# Patient Record
Sex: Male | Born: 1955 | Race: White | Hispanic: No | Marital: Single | Smoking: Never smoker
Health system: Southern US, Community
[De-identification: ages and names within clinical notes are randomized; demographics above are authoritative.]

## PROBLEM LIST (undated history)

## (undated) DIAGNOSIS — M47816 Spondylosis without myelopathy or radiculopathy, lumbar region: Secondary | ICD-10-CM

## (undated) DIAGNOSIS — K219 Gastro-esophageal reflux disease without esophagitis: Secondary | ICD-10-CM

## (undated) DIAGNOSIS — G8929 Other chronic pain: Secondary | ICD-10-CM

## (undated) DIAGNOSIS — M5442 Lumbago with sciatica, left side: Secondary | ICD-10-CM

## (undated) DIAGNOSIS — I10 Essential (primary) hypertension: Secondary | ICD-10-CM

## (undated) DIAGNOSIS — E785 Hyperlipidemia, unspecified: Secondary | ICD-10-CM

## (undated) DIAGNOSIS — K589 Irritable bowel syndrome without diarrhea: Secondary | ICD-10-CM

## (undated) HISTORY — DX: Lumbago with sciatica, left side: M54.42

## (undated) HISTORY — DX: Hyperlipidemia, unspecified: E78.5

## (undated) HISTORY — DX: Spondylosis without myelopathy or radiculopathy, lumbar region: M47.816

## (undated) HISTORY — DX: Other chronic pain: G89.29

## (undated) HISTORY — DX: Gastro-esophageal reflux disease without esophagitis: K21.9

## (undated) HISTORY — DX: Irritable bowel syndrome without diarrhea: K58.9

---

## 2013-04-04 ENCOUNTER — Emergency Department (HOSPITAL_COMMUNITY)
Admission: EM | Admit: 2013-04-04 | Discharge: 2013-04-04 | Disposition: A | Discharge status: Home Health | Payer: Federal, State, Local not specified - PPO | Attending: Emergency Medicine | Admitting: Emergency Medicine

## 2013-04-04 ENCOUNTER — Encounter (HOSPITAL_COMMUNITY): Payer: Self-pay | Admitting: *Deleted

## 2013-04-04 ENCOUNTER — Emergency Department (HOSPITAL_COMMUNITY): Payer: Federal, State, Local not specified - PPO

## 2013-04-04 DIAGNOSIS — R42 Dizziness and giddiness: Secondary | ICD-10-CM | POA: Insufficient documentation

## 2013-04-04 DIAGNOSIS — Z79899 Other long term (current) drug therapy: Secondary | ICD-10-CM | POA: Insufficient documentation

## 2013-04-04 DIAGNOSIS — M549 Dorsalgia, unspecified: Secondary | ICD-10-CM | POA: Insufficient documentation

## 2013-04-04 DIAGNOSIS — I10 Essential (primary) hypertension: Secondary | ICD-10-CM | POA: Insufficient documentation

## 2013-04-04 DIAGNOSIS — R252 Cramp and spasm: Secondary | ICD-10-CM

## 2013-04-04 DIAGNOSIS — R51 Headache: Secondary | ICD-10-CM | POA: Insufficient documentation

## 2013-04-04 DIAGNOSIS — R197 Diarrhea, unspecified: Secondary | ICD-10-CM | POA: Insufficient documentation

## 2013-04-04 HISTORY — DX: Essential (primary) hypertension: I10

## 2013-04-04 LAB — COMPREHENSIVE METABOLIC PANEL
ALT: 46 U/L (ref 0–53)
CO2: 28 mEq/L (ref 19–32)
Calcium: 9.2 mg/dL (ref 8.4–10.5)
Creatinine, Ser: 1.05 mg/dL (ref 0.50–1.35)
GFR calc Af Amer: >90 mL/min (ref >90)
GFR calc non Af Amer: 78 mL/min — ABNORMAL LOW (ref >90)
Glucose, Bld: 101 mg/dL — ABNORMAL HIGH (ref 70–99)
Sodium: 135 mEq/L (ref 135–145)
Total Protein: 7.1 g/dL (ref 6.0–8.3)

## 2013-04-04 LAB — CBC WITH DIFFERENTIAL/PLATELET
Eosinophils Absolute: 0.1 10*3/uL (ref 0.0–0.7)
Eosinophils Relative: 2 % (ref 0–5)
HCT: 48.2 % (ref 39.0–52.0)
Lymphocytes Relative: 26 % (ref 12–46)
Lymphs Abs: 1.8 10*3/uL (ref 0.7–4.0)
MCH: 27.7 pg (ref 26.0–34.0)
MCV: 83.4 fL (ref 78.0–100.0)
Monocytes Absolute: 0.8 10*3/uL (ref 0.1–1.0)
Platelets: 162 10*3/uL (ref 150–400)
RBC: 5.78 MIL/uL (ref 4.22–5.81)
RDW: 13.1 % (ref 11.5–15.5)

## 2013-04-04 LAB — MAGNESIUM: Magnesium: 2.1 mg/dL (ref 1.5–2.5)

## 2013-04-04 LAB — CK: Total CK: 161 U/L (ref 7–232)

## 2013-04-04 LAB — LACTIC ACID, PLASMA: Lactic Acid, Venous: 1.1 mmol/L (ref 0.5–2.2)

## 2013-04-04 MED ORDER — SODIUM CHLORIDE 0.9 % IV BOLUS (SEPSIS)
1000.0000 mL | Freq: Once | INTRAVENOUS | Status: AC
  Administered 2013-04-04: 1000 mL via INTRAVENOUS

## 2013-04-04 NOTE — ED Notes (Signed)
Reports onset wed of sweats/chills, headache and bodyaches. Had diarrhea on wed but denies any vomiting.

## 2013-04-04 NOTE — ED Provider Notes (Signed)
History     CSN: [id number]  Arrival date & time 04/04/13  1614   First MD Initiated Contact with Patient 04/04/13 1656      Chief Complaint  Patient presents with  . Fever  . 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  Wednesday he got very hot, developed tingling and cramping of all extremities. Yesterday he develop pain in his lower back.  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  Also has been getting quick sharp pains in his right temple. States today he got out of his truck and felt confused and felt like he was stumbling.  Denies fevers, chills, recent illness, weakness of the extremities.  Denies nausea.  Denies visual changes, balance problems, difficulty with his gait currently.  Denies CP, abdominal pain.   Patient is a 57 y.o. male presenting with fever and diarrhea. The history is provided by the patient.  Fever Associated symptoms: diarrhea and headaches   Associated symptoms: no chest pain, no cough, no dysuria, no nausea, no rash and no vomiting   Diarrhea Associated symptoms: headaches   Associated symptoms: no abdominal pain, no fever and no vomiting     Past Medical History  Diagnosis Date  . Hypertension     History reviewed. No pertinent past surgical history.  History reviewed. No pertinent family history.  History  Substance Use Topics  . Smoking status: Not on file  . Smokeless tobacco: Not on file  . Alcohol Use: No      Review of Systems  Constitutional: Negative for fever and appetite change.  Respiratory: Negative for cough and shortness of breath.   Cardiovascular: Negative for chest pain.  Gastrointestinal: Positive for diarrhea. Negative for nausea, vomiting and abdominal pain.       One episode of "diarrhea" 3 days ago,  resolved  Genitourinary: Negative for dysuria, urgency and frequency.  Musculoskeletal: Positive for back pain.  Skin: Negative for rash and wound.  Neurological: Positive for dizziness and headaches. Negative for syncope.    Allergies  Review of patient's allergies indicates no known allergies.  Home Medications   Current Outpatient Rx  Name  Route  Sig  Dispense  Refill  . lisinopril (PRINIVIL,ZESTRIL) 10 MG tablet   Oral   Take 10 mg by mouth daily.           BP 148/65  Pulse 72  Temp(Src) 97.9 F (36.6 C) (Oral)  Resp 18  SpO2 95%  Physical Exam  Nursing note and vitals reviewed. Constitutional: He appears well-developed and well-nourished. No distress.  HENT:  Head: Normocephalic and atraumatic.  Neck: Neck supple.  Cardiovascular: Normal rate and regular rhythm.   Pulmonary/Chest: Effort normal and breath sounds normal. No respiratory distress. He has no wheezes. He has no rales.  Abdominal: Soft. He exhibits no distension and no mass. There is no tenderness. There is no rebound and no guarding.  Musculoskeletal:  Lower extremities:  Strength 5/5, sensation intact, distal pulses intact.    Neurological: He is alert. He exhibits normal muscle tone.  Skin: He is not diaphoretic.    ED Course  Procedures (including critical care time)  Labs Reviewed  COMPREHENSIVE METABOLIC PANEL - Abnormal; Notable for the following:    Glucose, Bld 101 (*)    GFR calc non Af Amer 78 (*)  All other components within normal limits  CBC WITH DIFFERENTIAL  MAGNESIUM  LACTIC ACID, PLASMA  CK   Dg Lumbar Spine Complete  04/04/2013   *RADIOLOGY REPORT*  Clinical Data: Fever.  Diarrhea.  Mid back pain for 2 days. Tingling in both legs.  No injury.  LUMBAR SPINE - COMPLETE 4+ VIEW  Comparison: None.  Findings: Anatomic alignment of the lumbar spine.  Five lumbar type vertebral bodies.  Intervertebral disc height is preserved.  Mild degenerative disc disease at L1-L2 and L2-L3.   No pars defects. Right L5-S1 facet arthrosis.  Lumbosacral junction appears normal. Metallic density projects over the rectum on the frontal view, presumably external to the patient.  This is not seen on any of the other views.  IMPRESSION: Mild lumbar spondylosis.  No acute osseous abnormality.   Original Report Authenticated By: Andreas Newport, M.D.    5:54 PM Discussed pt with Dr Anitra Lauth.    7:47 PM Pt feeling much better after IVF.    1. 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  He drinks large amounts of water daily and attempts to also drink gatorade to replace electrolytes.  P/W cramps and tingling in his extremities, one episode of low back pain.  Neurovascularly intact on exam.  No red flags. Discussed all results with patient. Pt to f/u with PCP in 2-3 days. Pt given return precautions.  Pt verbalizes understanding and agrees with plan.           Trixie Dredge, PA-C 04/04/13 2107

## 2013-04-07 NOTE — ED Provider Notes (Signed)
Medical screening examination/treatment/procedure(s) were performed by non-physician practitioner and as supervising physician I was immediately available for consultation/collaboration.   Gwyneth Sprout, MD 04/07/13 1124

## 2014-10-21 NOTE — ED Notes (Signed)
Patient here today requesting a note to validate need to wear shorts.  Spoke with Dr Anitra LauthPlunkett and note created stating due to patient's complaints related to heat, patient should be allowed to wear shorts as needed to prevent repeat episodes.

## 2015-05-08 ENCOUNTER — Emergency Department (INDEPENDENT_AMBULATORY_CARE_PROVIDER_SITE_OTHER)
Admission: EM | Admit: 2015-05-08 | Discharge: 2015-05-08 | Disposition: A | Discharge status: Home / Self Care | Payer: Worker's Compensation | Source: Home / Self Care

## 2015-05-08 ENCOUNTER — Encounter (HOSPITAL_COMMUNITY): Payer: Self-pay | Admitting: Emergency Medicine

## 2015-05-08 ENCOUNTER — Emergency Department (INDEPENDENT_AMBULATORY_CARE_PROVIDER_SITE_OTHER): Payer: Federal, State, Local not specified - PPO

## 2015-05-08 DIAGNOSIS — M25551 Pain in right hip: Secondary | ICD-10-CM

## 2015-05-08 DIAGNOSIS — W010XXA Fall on same level from slipping, tripping and stumbling without subsequent striking against object, initial encounter: Secondary | ICD-10-CM | POA: Diagnosis not present

## 2015-05-08 MED ORDER — DICLOFENAC SODIUM 75 MG PO TBEC: 75.0000 mg | DELAYED_RELEASE_TABLET | Freq: Two times a day (BID) | ORAL | Status: DC

## 2015-05-08 MED ORDER — HYDROCODONE-ACETAMINOPHEN 5-325 MG PO TABS: 1.0000 | ORAL_TABLET | Freq: Four times a day (QID) | ORAL | Status: DC | PRN

## 2015-05-08 NOTE — ED Provider Notes (Signed)
CSN: [id number]     Arrival date & time 05/08/15  1502 History   First MD Initiated Contact with Patient 05/08/15 1545     Chief Complaint  Patient presents with  . Fall   (Consider location/radiation/quality/duration/timing/severity/associated sxs/prior Treatment) Patient is a 59 y.o. male presenting with fall. The history is provided by the patient.  Fall This is a new problem. The current episode started 3 to 5 hours ago. The problem occurs constantly. The problem has been gradually worsening. Pertinent negatives include no chest pain, no abdominal pain, no headaches and no shortness of breath. Nothing aggravates the symptoms. Nothing relieves the symptoms. He has tried nothing for the symptoms.   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  Left leg went out from under him and he landed on right hip.  He continued delivery route but pain worsened.  Pain is radiating down side of leg.  Worse with weight bearing Past Medical History  Diagnosis Date  . Hypertension    History reviewed. No pertinent past surgical history. History reviewed. No pertinent family history. History  Substance Use Topics  . Smoking status: Not on file  . Smokeless tobacco: Not on file  . Alcohol Use: No    Review of Systems  Constitutional: Negative.   HENT: Negative.   Eyes: Negative.   Respiratory: Negative.  Negative for shortness of breath.   Cardiovascular: Negative.  Negative for chest pain.  Gastrointestinal: Negative.  Negative for abdominal pain.  Genitourinary: Negative.   Musculoskeletal:       Pain and tenderness superior pelvis on right upper crest  Skin: Negative.   Neurological: Negative for dizziness, syncope, weakness and headaches.       Negative SLR, normal KJ and AJ Good active ROM knee and ankle  Psychiatric/Behavioral: Negative.     Allergies  Review of patient's allergies indicates no known allergies.  Home Medications   Prior to Admission medications    Medication Sig Start Date End Date Taking? Authorizing Provider  lisinopril (PRINIVIL,ZESTRIL) 10 MG tablet Take 10 mg by mouth daily.    Historical Provider, MD   BP 146/74 mmHg  Pulse 65  Temp(Src) 98.4 F (36.9 C) (Oral)  Resp 16  SpO2 97% Physical Exam  Constitutional: He is oriented to person, place, and time. He appears well-developed and well-nourished.  Neck: Normal range of motion. Neck supple.  Pulmonary/Chest: Effort normal.  Musculoskeletal:  Good hip, knee and ankle passive ROM Tender superior right iliac crest. No ecchymosis  Normal upper extremities.  Neurological: He is alert and oriented to person, place, and time. He has normal reflexes.  Skin: Skin is warm and dry.  Psychiatric: He has a normal mood and affect. His behavior is normal. Judgment and thought content normal.    ED Course  Procedures (including critical care time) Labs Review Labs Reviewed - No data to display  Imaging Review Preliminary reading of right hip:  No fracture seen  MDM   1. Fall due to wet surface, initial encounter    This seems rather painful, and I don't think patient should bear weight at work until rechecked in 2 days. I will fill out the Worker's Comp. form sit the patient brought with him and give him a note to return here for recheck in 48 hours.      ICD-9-CM ICD-10-CM   1. Hip pain, acute, right 719.45 M25.551 HYDROcodone-acetaminophen (NORCO) 5-325 MG per tablet     diclofenac (VOLTAREN) 75 MG EC  tablet  2. Fall due to wet surface, initial encounter E885.9 W01.0XXA DG Hip Unilat With Pelvis 2-3 Views Right     DG Hip Unilat With Pelvis 2-3 Views Right     HYDROcodone-acetaminophen (NORCO) 5-325 MG per tablet     diclofenac (VOLTAREN) 75 MG EC tablet     Elvina Sidle, MD  Elvina Sidle, MD 05/08/15 [phone]

## 2015-05-08 NOTE — Discharge Instructions (Signed)
Please return in 2 days so that we can better determine your work status for the next week.  Do not return to work until rechecked Monday afternoon.  In the meantime, rest the leg and hip and take the medicines as prescribed.

## 2015-05-08 NOTE — ED Notes (Signed)
Here for workers comp States he slipped on sidewalk with left leg today States right leg hurts and right butt cheek is painful States right leg is numb

## 2017-05-25 ENCOUNTER — Encounter (HOSPITAL_COMMUNITY): Payer: Self-pay

## 2017-05-25 ENCOUNTER — Emergency Department (HOSPITAL_COMMUNITY): Payer: Federal, State, Local not specified - PPO

## 2017-05-25 ENCOUNTER — Emergency Department (HOSPITAL_COMMUNITY)
Admission: EM | Admit: 2017-05-25 | Discharge: 2017-05-25 | Disposition: A | Discharge status: Home / Self Care | Payer: Federal, State, Local not specified - PPO | Attending: Emergency Medicine | Admitting: Emergency Medicine

## 2017-05-25 DIAGNOSIS — Z79899 Other long term (current) drug therapy: Secondary | ICD-10-CM | POA: Diagnosis not present

## 2017-05-25 DIAGNOSIS — Y990 Civilian activity done for income or pay: Secondary | ICD-10-CM | POA: Diagnosis not present

## 2017-05-25 DIAGNOSIS — T675XXA Heat exhaustion, unspecified, initial encounter: Secondary | ICD-10-CM | POA: Diagnosis not present

## 2017-05-25 DIAGNOSIS — X30XXXA Exposure to excessive natural heat, initial encounter: Secondary | ICD-10-CM | POA: Insufficient documentation

## 2017-05-25 DIAGNOSIS — Y92812 Truck as the place of occurrence of the external cause: Secondary | ICD-10-CM | POA: Insufficient documentation

## 2017-05-25 DIAGNOSIS — Y9389 Activity, other specified: Secondary | ICD-10-CM | POA: Insufficient documentation

## 2017-05-25 DIAGNOSIS — I10 Essential (primary) hypertension: Secondary | ICD-10-CM | POA: Insufficient documentation

## 2017-05-25 DIAGNOSIS — R200 Anesthesia of skin: Secondary | ICD-10-CM | POA: Diagnosis present

## 2017-05-25 LAB — COMPREHENSIVE METABOLIC PANEL
ALK PHOS: 82 U/L (ref 38–126)
ALT: 23 U/L (ref 17–63)
AST: 25 U/L (ref 15–41)
Albumin: 3.7 g/dL (ref 3.5–5.0)
Anion gap: 5 (ref 5–15)
BILIRUBIN TOTAL: 1 mg/dL (ref 0.3–1.2)
BUN: 13 mg/dL (ref 6–20)
CALCIUM: 9 mg/dL (ref 8.9–10.3)
CO2: 27 mmol/L (ref 22–32)
CREATININE: 0.93 mg/dL (ref 0.61–1.24)
Chloride: 107 mmol/L (ref 101–111)
GFR calc Af Amer: >60 mL/min (ref >60)
Glucose, Bld: 91 mg/dL (ref 65–99)
POTASSIUM: 4.3 mmol/L (ref 3.5–5.1)
Sodium: 139 mmol/L (ref 135–145)
TOTAL PROTEIN: 6.2 g/dL — AB (ref 6.5–8.1)

## 2017-05-25 LAB — CBC WITH DIFFERENTIAL/PLATELET
BASOS ABS: 0 10*3/uL (ref 0.0–0.1)
Basophils Relative: 1 %
Eosinophils Absolute: 0.2 10*3/uL (ref 0.0–0.7)
Eosinophils Relative: 3 %
HEMATOCRIT: 46.4 % (ref 39.0–52.0)
HEMOGLOBIN: 15.4 g/dL (ref 13.0–17.0)
LYMPHS PCT: 27 %
Lymphs Abs: 1.5 10*3/uL (ref 0.7–4.0)
MCH: 27.7 pg (ref 26.0–34.0)
MCHC: 33.2 g/dL (ref 30.0–36.0)
MCV: 83.6 fL (ref 78.0–100.0)
MONO ABS: 0.6 10*3/uL (ref 0.1–1.0)
Monocytes Relative: 10 %
NEUTROS ABS: 3.4 10*3/uL (ref 1.7–7.7)
Neutrophils Relative %: 59 %
Platelets: 158 10*3/uL (ref 150–400)
RBC: 5.55 MIL/uL (ref 4.22–5.81)
RDW: 12.8 % (ref 11.5–15.5)
WBC: 5.7 10*3/uL (ref 4.0–10.5)

## 2017-05-25 LAB — I-STAT TROPONIN, ED: Troponin i, poc: 0 ng/mL (ref 0.00–0.08)

## 2017-05-25 LAB — CK: CK TOTAL: 97 U/L (ref 49–397)

## 2017-05-25 LAB — MAGNESIUM: MAGNESIUM: 2 mg/dL (ref 1.7–2.4)

## 2017-05-25 MED ORDER — METOCLOPRAMIDE HCL 5 MG/ML IJ SOLN
10.0000 mg | Freq: Once | INTRAMUSCULAR | Status: AC
  Administered 2017-05-25: 10 mg via INTRAVENOUS
  Filled 2017-05-25: qty 2

## 2017-05-25 MED ORDER — DIPHENHYDRAMINE HCL 50 MG/ML IJ SOLN
25.0000 mg | Freq: Once | INTRAMUSCULAR | Status: AC
  Administered 2017-05-25: 25 mg via INTRAVENOUS
  Filled 2017-05-25: qty 1

## 2017-05-25 MED ORDER — SODIUM CHLORIDE 0.9 % IV BOLUS (SEPSIS)
1000.0000 mL | Freq: Once | INTRAVENOUS | Status: AC
  Administered 2017-05-25: 1000 mL via INTRAVENOUS

## 2017-05-25 NOTE — ED Notes (Signed)
Patient transported to CT 

## 2017-05-25 NOTE — ED Notes (Signed)
Patient transported to X-ray 

## 2017-05-25 NOTE — ED Provider Notes (Signed)
[facility]-EMERGENCY DEPT Provider Note   CSN: [id number] Arrival date & time: 05/25/17  1454     History   Chief Complaint Chief Complaint  Patient presents with  . Numbness    HPI [name] is a 61 y.o. male.  HPI   61 year old male with past medical history of hypertension who presents with multiple complaints. 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 He states that he was working in the truck when he began to feel lightheaded. He then developed a mild, aching headache with bilateral hand tingling and numbness. He then noticed a swelling sensation in numbness in his right leg. Since then, his tingling has persisted, worse on the right but also present on the left, as well as a dull, aching, throbbing headache. Denies any chest pain or shortness of breath. Denies any difficulty speaking or swallowing. He does endorse a sensation of intermittent blurred vision but denies any double vision. No other medical complaint.  Past Medical History:  Diagnosis Date  . Hypertension     There are no active problems to display for this patient.   History reviewed. No pertinent surgical history.     Home Medications    Prior to Admission medications   Medication Sig Start Date End Date Taking? Authorizing Provider  acetaminophen (TYLENOL) 500 MG tablet Take 500 mg by mouth every 6 (six) hours as needed.   Yes [provider]  ibuprofen (ADVIL,MOTRIN) 200 MG tablet Take 200 mg by mouth every 6 (six) hours as needed (for pain).   Yes [provider]  diclofenac (VOLTAREN) 75 MG EC tablet Take 1 tablet (75 mg total) by mouth 2 (two) times daily. Patient not taking: Reported on 05/25/2017 05/08/15   Elvina SidleLauenstein, Kurt, MD  HYDROcodone-acetaminophen (NORCO) 5-325 MG per tablet Take 1 tablet by mouth every 6 (six) hours as needed for moderate pain. 05/08/15   Elvina SidleLauenstein, Kurt, MD  lisinopril (PRINIVIL,ZESTRIL) 10 MG  tablet Take 10 mg by mouth daily.    [provider]    Family History History reviewed. No pertinent family history.  Social History Social History  Substance Use Topics  . Smoking status: Never Smoker  . Smokeless tobacco: Current User    Types: Snuff  . Alcohol use No     Allergies   Patient has no known allergies.   Review of Systems Review of Systems  Constitutional: Positive for diaphoresis and fatigue.  Neurological: Positive for numbness and headaches.  All other systems reviewed and are negative.    Physical Exam Updated Vital Signs BP 138/74 (BP Location: Right Arm)   Pulse (!) 55   Temp 98.6 F (37 C) (Oral)   Resp 17   Ht 5\' 11"  (1.803 m)   Wt 122.5 kg (270 lb)   SpO2 94%   BMI 37.66 kg/m   Physical Exam  Constitutional: He is oriented to person, place, and time. He appears well-developed and well-nourished. No distress.  HENT:  Head: Normocephalic and atraumatic.  Mouth/Throat: Oropharynx is clear and moist.  Eyes: Conjunctivae are normal.  Neck: Neck supple.  Cardiovascular: Normal rate, regular rhythm and normal heart sounds.  Exam reveals no friction rub.   No murmur heard. Pulmonary/Chest: Effort normal and breath sounds normal. No respiratory distress. He has no wheezes. He has no rales.  Abdominal: He exhibits no distension.  Musculoskeletal: He exhibits no edema.  Neurological: He is alert and oriented to  person, place, and time. He exhibits normal muscle tone.  Skin: Skin is warm. Capillary refill takes less than 2 seconds.  Psychiatric: He has a normal mood and affect.  Nursing note and vitals reviewed.   Neurological Exam:  Mental Status: Alert and oriented to person, place, and time. Attention and concentration normal. Speech clear. Recent memory is intact. Cranial Nerves: Visual fields grossly intact. EOMI and PERRLA. No nystagmus noted. Facial sensation intact at forehead, maxillary cheek, and chin/mandible bilaterally.  No facial asymmetry or weakness. Hearing grossly normal. Uvula is midline, and palate elevates symmetrically. Normal SCM and trapezius strength. Tongue midline without fasciculations. Motor: Muscle strength 5/5 in proximal and distal UE and LE bilaterally. No pronator drift. Muscle tone normal. Reflexes: 2+ and symmetrical in all four extremities.  Sensation: Intact to light touch in upper and lower extremities distally bilaterally.  Gait: Normal without ataxia. Coordination: Normal FTN bilaterally.      ED Treatments / Results  Labs (all labs ordered are listed, but only abnormal results are displayed) Labs Reviewed  COMPREHENSIVE METABOLIC PANEL - Abnormal; Notable for the following:       Result Value   Total Protein 6.2 (*)    All other components within normal limits  CBC WITH DIFFERENTIAL/PLATELET  MAGNESIUM  CK  I-STAT TROPONIN, ED    EKG  EKG Interpretation None       Radiology Dg Chest 2 View  Result Date: 05/25/2017 CLINICAL DATA:  Headache, right-sided numbness EXAM: CHEST  2 VIEW COMPARISON:  11/19/2015 FINDINGS: The heart size and mediastinal contours are within normal limits. Both lungs are clear. The visualized skeletal structures are unremarkable. IMPRESSION: No active cardiopulmonary disease. Electronically Signed   By: Marlan Palauharles  Clark M.D.   On: 05/25/2017 16:02   Ct Head Wo Contrast  Result Date: 05/25/2017 CLINICAL DATA:  Headache, R sided numbness Pt sts he had a heat stroke today Denies LOC No hx of strokes Hx of HTN EXAM: CT HEAD WITHOUT CONTRAST TECHNIQUE: Contiguous axial images were obtained from the base of the skull through the vertex without intravenous contrast. COMPARISON:  04/05/1999 by report only FINDINGS: Brain: No evidence of acute infarction, hemorrhage, hydrocephalus, extra-axial collection or mass lesion/mass effect. Vascular: No hyperdense vessel or unexpected calcification. Skull: Normal. Negative for fracture or focal lesion.  Sinuses/Orbits: No acute finding. Other: None. IMPRESSION: 1. Negative for bleed or other acute intracranial process Electronically Signed   By: Corlis Leak  Hassell M.D.   On: 05/25/2017 16:42    Procedures Procedures (including critical care time)  Medications Ordered in ED Medications  metoCLOPramide (REGLAN) injection 10 mg (10 mg Intravenous Given 05/25/17 1616)  diphenhydrAMINE (BENADRYL) injection 25 mg (25 mg Intravenous Given 05/25/17 1615)  sodium chloride 0.9 % bolus 1,000 mL (0 mLs Intravenous Stopped 05/25/17 1715)     Initial Impression / Assessment and Plan / ED Course  I have reviewed the triage vital signs and the nursing notes.  Pertinent labs & imaging results that were available during my care of the patient were reviewed by me and considered in my medical decision making (see chart for details).     61 yo M here with transient tingling in b/l UE and LE (R>L), near syncopal sx in setting of working in >120F heat in the back of a truck. Symptoms now improving/resolved after removal from heat. Neurological exam is non-focal. Labs are reassuring, with normal lytes, normal CBC. Trop neg, EKG non-ischemic, do not suspect ACS. Pt's neuro sx are "tingling" in  hands and feet and is not c/w vascular neuro distribution, CT head neg, and I do not suspect TIA or CVA. He has a h/o similar sx in setting of heat exposure.   Pt feels much improved in ED after removal from heat. Will encourage him to drink fluids at home, and I have written a note for him to avoid high heat environments at work. Return precautions given.  Final Clinical Impressions(s) / ED Diagnoses   Final diagnoses:  Heat exhaustion, initial encounter    New Prescriptions Discharge Medication List as of 05/25/2017  5:56 PM       Shaune Pollack, MD 05/26/17 1225

## 2017-05-25 NOTE — ED Notes (Signed)
Pt. Ambulated in hallway with steady gait. Pt. States his headache is better and he is ready to go home. EDP made aware.

## 2017-05-25 NOTE — ED Notes (Signed)
EDP at bedside  

## 2017-05-25 NOTE — ED Triage Notes (Signed)
Pt. Here [facility] [employer] [facility] [employer] for numbness to right hand and foot as well as a mild headache that comes and goes. 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 Pt. States he has been drinking plenty of water. Pt. States similar episode happened over a year ago from heat. Pt. Given 250mL normal saline en route. EMS reports no neuro deficits. Pt. Aox4.

## 2017-06-07 ENCOUNTER — Ambulatory Visit (INDEPENDENT_AMBULATORY_CARE_PROVIDER_SITE_OTHER): Payer: Federal, State, Local not specified - PPO | Admitting: Neurology

## 2017-06-07 ENCOUNTER — Encounter: Payer: Self-pay | Admitting: Neurology

## 2017-06-07 VITALS — BP 160/87 | HR 68 | Ht 71.0 in | Wt 277.0 lb

## 2017-06-07 DIAGNOSIS — R202 Paresthesia of skin: Secondary | ICD-10-CM | POA: Diagnosis not present

## 2017-06-07 DIAGNOSIS — G478 Other sleep disorders: Secondary | ICD-10-CM

## 2017-06-07 DIAGNOSIS — R0681 Apnea, not elsewhere classified: Secondary | ICD-10-CM

## 2017-06-07 DIAGNOSIS — Z820 Family history of epilepsy and other diseases of the nervous system: Secondary | ICD-10-CM | POA: Diagnosis not present

## 2017-06-07 DIAGNOSIS — E669 Obesity, unspecified: Secondary | ICD-10-CM | POA: Diagnosis not present

## 2017-06-07 DIAGNOSIS — R0683 Snoring: Secondary | ICD-10-CM

## 2017-06-07 DIAGNOSIS — Z9189 Other specified personal risk factors, not elsewhere classified: Secondary | ICD-10-CM | POA: Diagnosis not present

## 2017-06-07 DIAGNOSIS — R200 Anesthesia of skin: Secondary | ICD-10-CM | POA: Diagnosis not present

## 2017-06-07 DIAGNOSIS — R351 Nocturia: Secondary | ICD-10-CM | POA: Diagnosis not present

## 2017-06-07 NOTE — Patient Instructions (Signed)
Thank you for choosing [facility] for your neurological care! It was nice to meet you today! I appreciate that you entrust me with your healthcare concerns. I hope, I was able to address at least some of your concerns today.    Here is what we discussed today and what we came up with as our plan for you:   We will do an EMG and nerve conduction velocity test, which is an electrical nerve and muscle test, which we will schedule. We will call you with the results.  We will do a brain scan, called MRI and call you with the test results. We will have to schedule you for this on a separate date. This test requires authorization from your insurance, and we will take care of the insurance process.  Based on your symptoms and your exam I believe you are at risk for obstructive sleep apnea or OSA, and I think we should proceed with a sleep study to determine whether you do or do not have OSA and how severe it is. If you have more than mild OSA, I want you to consider treatment with CPAP. Please remember, the risks and ramifications of moderate to severe obstructive sleep apnea or OSA are: Cardiovascular disease, including congestive heart failure, stroke, difficult to control hypertension, arrhythmias, and even type 2 diabetes has been linked to untreated OSA. Sleep apnea causes disruption of sleep and sleep deprivation in most cases, which, in turn, can cause recurrent headaches, problems with memory, mood, concentration, focus, and vigilance. Most people with untreated sleep apnea report excessive daytime sleepiness, which can affect their ability to drive. Please do not drive if you feel sleepy.   I will likely see you back after your sleep study to go over the test results and where to go from there. We will call you after your sleep study to advise about the results (most likely, you will hear from Lafonda Mossesiana, my nurse) and to set up an appointment at the time, as necessary.    Our sleep lab  administrative assistant, Alvis LemmingsDawn will meet with you or call you to schedule your sleep study. If you don't hear back from her by next week please feel free to call her at [phone]. This is her direct line and please leave a message with your phone number to call back if you get the voicemail box. She will call back as soon as possible.

## 2017-06-07 NOTE — Progress Notes (Signed)
Subjective:    Patient ID: [name] is a 61 y.o. male.  HPI     Gerald Foley, MD, PhD [facility] [address], [address] [address] [place], [place] [address]  Dear Lorelle Formosa,   I saw your patient, [name], upon your kind request in my neurologic clinic today for initial consultation of his numbness. The patient is unaccompanied today. As you know, [name] is a 61 year old right-handed gentleman with an underlying medical history of hypertension, hyperlipidemia, reflux disease, lumbar spondylosis and left-sided low back pain with sciatica, as well as obesity, who was recently diagnosed with heat exhaustion. He presented to the emergency room on 05/25/2017 after working in a truck and feeling lightheaded, then developed bilateral hand tingling and numbness. He was brought in by EMS. Patient reported that he had been drinking water. He also reported a prior episode of heat exhaustion in 2016. In the emergency room his vital signs are stable, temperature was 98.6. He was treated symptomatically with Reglan, IV fluids, Benadryl, and neurological exam was nonfocal at the time.  He had a head CT without contrast on 05/25/2017 which I reviewed: IMPRESSION: 1. Negative for bleed or other acute intracranial process.  I reviewed your office note from 05/29/2017. You ordered a repeat CMP. CMP was unremarkable on 05/25/2017 with the exception of slightly reduced total protein at 6.2. He reports that he has had ongoing numbness and tingling in his right hand and below the knees bilaterally. He feels weaker overall, lack of energy, not sleeping well since his ER visit. He feels like his left upper extremity has recovered to normalcy. He has not fallen. He works for the [employer]. He has a history of snoring. Girlfriend has reported apneic pauses to him while asleep. He has had more sleep disruption lately. He has nocturia about once per average night. He drinks caffeine in  the form of coffee, usually one cup per day and tea, 1-2 glasses per day. He is a nonsmoker. He drinks alcohol occasionally, typically on weekends. He lives with his girlfriend. He feels like his legs feel cold all the time. He has a family history of obstructive sleep apnea in his mother who uses a CPAP machine. Mother has circulation problems. Brother has diabetes. CK on 05/25/17 was 97.   His Past Medical History Is Significant For: Past Medical History:  Diagnosis Date  . Chronic left-sided low back pain with left-sided sciatica   . Esophageal reflux   . Hyperlipidemia   . Hypertension   . Irritable colon   . Lumbar spondylosis     His Past Surgical History Is Significant For: No past surgical history on file.  His Family History Is Significant For: Family History  Problem Relation Age of Onset  . Diabetes Mother   . Heart disease Mother   . Parkinson's disease Father   . Heart disease Father   . Diabetes Father     His Social History Is Significant For: Social History   Social History  . Marital status: Single    Spouse name: N/A  . Number of children: N/A  . Years of education: N/A   Social History Main Topics  . Smoking status: Never Smoker  . Smokeless tobacco: Current User    Types: Snuff  . Alcohol use No  . Drug use: No  . Sexual activity: Not Asked   Other Topics Concern  . None   Social History Narrative  . None  His Allergies Are:  No Known Allergies:   His Current Medications Are:  Outpatient Encounter Prescriptions as of 06/07/2017  Medication Sig  . fluticasone (FLONASE) 50 MCG/ACT nasal spray Place into both nostrils daily.  Marland Kitchen. ibuprofen (ADVIL,MOTRIN) 200 MG tablet Take 200 mg by mouth every 6 (six) hours as needed (for pain).  Marland Kitchen. lisinopril (PRINIVIL,ZESTRIL) 10 MG tablet Take 10 mg by mouth daily.  . [DISCONTINUED] acetaminophen (TYLENOL) 500 MG tablet Take 500 mg by mouth every 6 (six) hours as needed.  . [DISCONTINUED] diclofenac  (VOLTAREN) 75 MG EC tablet Take 1 tablet (75 mg total) by mouth 2 (two) times daily. (Patient not taking: Reported on 05/25/2017)  . [DISCONTINUED] HYDROcodone-acetaminophen (NORCO) 5-325 MG per tablet Take 1 tablet by mouth every 6 (six) hours as needed for moderate pain.  . [DISCONTINUED] meloxicam (MOBIC) 7.5 MG tablet Take 7.5 mg by mouth daily.   No facility-administered encounter medications on file as of 06/07/2017.   :  Review of Systems:  Out of a complete 14 point review of systems, all are reviewed and negative with the exception of these symptoms as listed below:  Review of Systems  Neurological:       Pt presents today to discuss numbness in his r hard and lower legs and feet. Pt does not have good feeling in his bilateral legs below the knee following his heat exhaustion 2 weeks ago. Pt is unable to drive because of the lack of feeling in his feet. Pt had a sleep study many years ago but did not have sleep apnea.  Epworth Sleepiness Scale 0= would never doze 1= slight chance of dozing 2= moderate chance of dozing 3= high chance of dozing  Sitting and reading: 1 Watching TV: 2 Sitting inactive in a public place (ex. Theater or meeting): 1 As a passenger in a car for an hour without a break: 1 Lying down to rest in the afternoon: 2 Sitting and talking to someone: 0 Sitting quietly after lunch (no alcohol): 1 In a car, while stopped in traffic: 0 Total: 8     Objective:  Neurological Exam  Physical Exam Physical Examination:   Vitals:   06/07/17 1416  BP: (!) 160/87  Pulse: 68    General Examination: The patient is a very pleasant 61 y.o. male in no acute distress. He appears well-developed and well-nourished and well groomed.   HEENT: Normocephalic, atraumatic, pupils are equal, round and reactive to light and accommodation. Funduscopic exam is normal with sharp disc margins noted. Extraocular tracking is good without limitation to gaze excursion or nystagmus  noted. Normal smooth pursuit is noted. Hearing is grossly intact. Face is symmetric with normal facial animation and normal facial sensation. Speech is clear with no dysarthria noted. There is no hypophonia. There is no lip, neck/head, jaw or voice tremor. Neck is supple with full range of passive and active motion. There are no carotid bruits on auscultation. Oropharynx exam reveals: mild mouth dryness, adequate dental hygiene and marked airway crowding, due to Smaller airway entry, large uvula, thicker soft palate, redundant soft palate, tonsils of 2+ bilaterally. Neck circumference is 23-1/8 inches. Mallampati is class III. Tongue protrudes centrally and palate elevates symmetrically.   Chest: Clear to auscultation without wheezing, rhonchi or crackles noted.  Heart: S1+S2+0, regular and normal without murmurs, rubs or gallops noted.   Abdomen: Soft, non-tender and non-distended with normal bowel sounds appreciated on auscultation.  Extremities: There is no pitting edema in the distal lower extremities  bilaterally. Pedal pulses are intact.  Skin: Warm and dry without trophic changes noted. There are no varicose veins.  Musculoskeletal: exam reveals no obvious joint deformities, tenderness or joint swelling or erythema.   Neurologically:  Mental status: The patient is awake, alert and oriented in all 4 spheres. His immediate and remote memory, attention, language skills and fund of knowledge are appropriate. There is no evidence of aphasia, agnosia, apraxia or anomia. Speech is clear with normal prosody and enunciation. Thought process is linear. Mood is normal and affect is normal.  Cranial nerves II - XII are as described above under HEENT exam. In addition: shoulder shrug is normal with equal shoulder height noted. Motor exam: Normal bulk, strength and tone is noted. There is no drift, tremor or rebound. Romberg is negative. Reflexes are 2+ throughout. Babinski: Toes are flexor bilaterally.  Fine motor skills and coordination: intact with normal finger taps, normal hand movements, normal rapid alternating patting, normal foot taps and normal foot agility.  Cerebellar testing: No dysmetria or intention tremor on finger to nose testing. Heel to shin is unremarkable bilaterally. There is no truncal or gait ataxia.  Sensory exam: intact to light touch, pinprick, vibration, temperature sense in the upper and lower extremities, with the exception of decrease temperature sense in both feet reported.  Gait, station and balance: He stands easily. No veering to one side is noted. No leaning to one side is noted. Posture is age-appropriate and stance is narrow based. Gait shows normal stride length and normal pace. No problems turning are noted. Tandem walk is unremarkable.  Assessment and plan:  In summary, [name] is a very pleasant 61 y.o.-year old male with an underlying medical history of hypertension, hyperlipidemia, reflux disease, lumbar spondylosis and left-sided low back pain with sciatica, as well as obesity, who was recently in the emergency room about 2 weeks ago for heat exhaustion. He was treated symptomatically, CT head was negative, CK level was normal. CMP unremarkable. He reports ongoing issues since then including numbness and tingling in the right hand in both lower extremities distally. He has no telltale weakness on exam, no focality on exam with the exception of mildly decreased temperature sense around both feet, otherwise a very reassuring neurological exam. He also has been having sleep problems which include snoring, witnessed apneas, and recent onset of sleep disruption and nocturia. I would like to proceed with further testing. I did explain to the patient that I cannot necessarily tie in all of his symptoms together, neuropathy is typically not seen as a consequence of heat exhaustion but he may take a little longer to recover from his symptoms. Reassuringly he has a  fairly good neurological exam. We will do a brain MRI without contrast, EMG and nerve conduction testing of his lower extremities and proceed with sleep study testing to rule out obstructive sleep apnea as he may very well have underlying sleep apnea. I talked to him about this diagnosis quite a bit as well. We will call him with his test results and also make a follow-up appointment accordingly. He is in agreement with the plan. I answered all his questions today.  Thank you very much for allowing me to participate in the care of this nice patient. If I can be of any further assistance to you please do not hesitate to call me at [phone].  Sincerely,   Gerald Foley, MD, PhD

## 2017-06-15 ENCOUNTER — Ambulatory Visit
Admission: RE | Admit: 2017-06-15 | Discharge: 2017-06-15 | Disposition: A | Discharge status: Home / Self Care | Payer: Federal, State, Local not specified - PPO | Source: Ambulatory Visit | Attending: Neurology | Admitting: Neurology

## 2017-06-15 DIAGNOSIS — Z820 Family history of epilepsy and other diseases of the nervous system: Secondary | ICD-10-CM

## 2017-06-15 DIAGNOSIS — R202 Paresthesia of skin: Secondary | ICD-10-CM

## 2017-06-15 DIAGNOSIS — R200 Anesthesia of skin: Secondary | ICD-10-CM

## 2017-06-15 DIAGNOSIS — R0681 Apnea, not elsewhere classified: Secondary | ICD-10-CM

## 2017-06-15 DIAGNOSIS — G478 Other sleep disorders: Secondary | ICD-10-CM

## 2017-06-15 DIAGNOSIS — E669 Obesity, unspecified: Secondary | ICD-10-CM

## 2017-06-15 DIAGNOSIS — R0683 Snoring: Secondary | ICD-10-CM

## 2017-06-15 DIAGNOSIS — Z9189 Other specified personal risk factors, not elsewhere classified: Secondary | ICD-10-CM

## 2017-06-15 DIAGNOSIS — R351 Nocturia: Secondary | ICD-10-CM

## 2017-06-18 ENCOUNTER — Telehealth: Payer: Self-pay | Admitting: *Deleted

## 2017-06-18 NOTE — Telephone Encounter (Signed)
Left message requesting a return call.

## 2017-06-18 NOTE — Telephone Encounter (Signed)
-----   Message from Huston Foley, MD sent at 06/18/2017  8:31 AM EDT ----- Please call patient regarding the recent brain MRI: The brain scan showed a normal structure of the brain and no significant volume loss which we call atrophy. 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 There were changes in the deeper structures of the brain, which we call white matter changes or microvascular changes. These were reported as mild in His case. These are tiny white spots, that occur with time and are seen in a variety of conditions, including with normal aging, chronic hypertension, chronic headaches, especially migraine HAs, chronic diabetes, chronic hyperlipidemia. These are not strokes and no mass or lesion or contrast enhancement was seen which is reassuring. Again, there were no acute findings, such as a recent or larger old strokes, or mass or blood products. No further action is required on this test at this time, other than re-enforcing the importance of good blood pressure control, good cholesterol control, good blood sugar control, and weight management. Please remind patient to keep any upcoming appointments or tests and to call us with any interim questions, concerns, problems or updates. Thanks,  Huston Foley, MD, PhD

## 2017-06-18 NOTE — Progress Notes (Signed)
Please call patient regarding the recent brain MRI: The brain scan showed a normal structure of the brain and no significant volume loss which we call atrophy. 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 There were changes in the deeper structures of the brain, which we call white matter changes or microvascular changes. These were reported as mild in His case. These are tiny white spots, that occur with time and are seen in a variety of conditions, including with normal aging, chronic hypertension, chronic headaches, especially migraine HAs, chronic diabetes, chronic hyperlipidemia. These are not strokes and no mass or lesion or contrast enhancement was seen which is reassuring. Again, there were no acute findings, such as a recent or larger old strokes, or mass or blood products. No further action is required on this test at this time, other than re-enforcing the importance of good blood pressure control, good cholesterol control, good blood sugar control, and weight management. Please remind patient to keep any upcoming appointments or tests and to call us with any interim questions, concerns, problems or updates. Thanks,  Huston Foley, MD, PhD

## 2017-06-19 ENCOUNTER — Ambulatory Visit (INDEPENDENT_AMBULATORY_CARE_PROVIDER_SITE_OTHER): Payer: Self-pay | Admitting: Neurology

## 2017-06-19 ENCOUNTER — Encounter: Payer: Self-pay | Admitting: Neurology

## 2017-06-19 ENCOUNTER — Ambulatory Visit (INDEPENDENT_AMBULATORY_CARE_PROVIDER_SITE_OTHER): Payer: Federal, State, Local not specified - PPO | Admitting: Neurology

## 2017-06-19 DIAGNOSIS — Z820 Family history of epilepsy and other diseases of the nervous system: Secondary | ICD-10-CM

## 2017-06-19 DIAGNOSIS — R200 Anesthesia of skin: Secondary | ICD-10-CM

## 2017-06-19 DIAGNOSIS — G478 Other sleep disorders: Secondary | ICD-10-CM

## 2017-06-19 DIAGNOSIS — R351 Nocturia: Secondary | ICD-10-CM

## 2017-06-19 DIAGNOSIS — E669 Obesity, unspecified: Secondary | ICD-10-CM

## 2017-06-19 DIAGNOSIS — R202 Paresthesia of skin: Secondary | ICD-10-CM

## 2017-06-19 DIAGNOSIS — R0683 Snoring: Secondary | ICD-10-CM

## 2017-06-19 DIAGNOSIS — Z9189 Other specified personal risk factors, not elsewhere classified: Secondary | ICD-10-CM

## 2017-06-19 DIAGNOSIS — R0681 Apnea, not elsewhere classified: Secondary | ICD-10-CM

## 2017-06-19 NOTE — Progress Notes (Signed)
Please refer to EMG and nerve conduction study procedure note. 

## 2017-06-19 NOTE — Progress Notes (Signed)
[facility]    Nerve / Sites Muscle Latency Ref. Amplitude Ref. Rel Amp Segments Distance Velocity Ref. Area    ms ms mV mV %  cm m/s m/s mVms  R Median - APB     Wrist APB 3.5 ?4.4 8.7 ?4.0 100 Wrist - APB 7   26.3     Upper arm APB 7.4  8.3  95.8 Upper arm - Wrist 26 66 ?49 25.6  R Ulnar - ADM     Wrist ADM 2.3 ?3.3 8.1 ?6.0 100 Wrist - ADM 7   25.7     B.Elbow ADM 6.3  7.4  91.6 B.Elbow - Wrist 24 61 ?49 25.2     A.Elbow ADM 7.9  7.0  94.7 A.Elbow - B.Elbow 10 62 ?49 24.0         A.Elbow - Wrist      L Peroneal - EDB     Ankle EDB 3.9 ?6.5 12.7 ?2.0 100 Ankle - EDB 9   36.6     Fib head EDB 11.7  11.1  86.9 Fib head - Ankle 45 58 ?44 35.2     Pop fossa EDB 13.3  10.9  98.6 Pop fossa - Fib head 10 60 ?44 34.6         Pop fossa - Ankle      R Peroneal - EDB     Ankle EDB 3.4 ?6.5 7.7 ?2.0 100 Ankle - EDB 9   25.8     Fib head EDB 11.6  8.0  104 Fib head - Ankle 47 58 ?44 27.9     Pop fossa EDB 13.3  7.6  95 Pop fossa - Fib head 10 58 ?44 26.4         Pop fossa - Ankle      L Tibial - AH     Ankle AH 3.1 ?5.8 12.8 ?4.0 100 Ankle - AH 9   31.7     Pop fossa AH 15.0  3.5  27.3 Pop fossa - Ankle 49 41 ?41 8.7  R Tibial - AH     Ankle AH 3.1 ?5.8 8.6 ?4.0 100 Ankle - AH 9   25.1     Pop fossa AH 14.2  5.4  63.3 Pop fossa - Ankle 48 43 ?41 18.9                 SNC    Nerve / Sites Rec. Site Peak Lat Ref.  Amp Ref. Segments Distance    ms ms V V  cm  R Superficial peroneal - Ankle     Lat leg Ankle 3.0 ?4.4 4 ?6 Lat leg - Ankle 14  L Superficial peroneal - Ankle     Lat leg Ankle 2.9 ?4.4 19 ?6 Lat leg - Ankle 14         SNC    Nerve / Sites Rec. Site Peak Lat Amp Segments Distance    ms V  cm  R Median - Orthodromic (Dig II, Mid palm)     Dig II Wrist 3.4 26 Dig II - Wrist 13  R Ulnar - Orthodromic, (Dig V, Mid palm)     Dig V Wrist 3.1 14 Dig V - Wrist 20         F  Wave    Nerve F Lat Ref.   ms ms  R Median - APB 28.9 ?31.0       H Reflex    Nerve H  Lat Lat Hmax     ms ms   Left Right Ref. Left Right Ref.  Tibial - Soleus 31.8 30.9 ?35.0 24.5 24.9 ?35.0         EMG

## 2017-06-19 NOTE — Procedures (Signed)
     HISTORY:  [name] is a 61 year old gentleman who experienced an episode of heat stroke on the 05/25/2017. The patient began having paresthesias in the arms and legs at the time of the heat stroke, this has persisted to some degree affecting all 4 extremities. The patient is being evaluated for these symptoms.  NERVE CONDUCTION STUDIES:  Nerve conduction studies were performed on right upper extremity. The distal motor latencies and motor amplitudes for the median and ulnar nerves were within normal limits. The F wave latencies and nerve conduction velocities for these nerves were also normal. The sensory latencies for the median and ulnar nerves were normal.  Nerve conduction studies were performed on both lower extremities. The distal motor latencies and motor amplitudes for the peroneal and posterior tibial nerves were within normal limits. The nerve conduction velocities for these nerves were also normal. The H reflex latencies were normal. The sensory latencies for the peroneal nerves were within normal limits.   EMG STUDIES:  EMG study was performed on the left lower extremity:  The tibialis anterior muscle reveals 2 to 4K motor units with full recruitment. No fibrillations or positive waves were seen. The peroneus tertius muscle reveals 2 to 4K motor units with full recruitment. No fibrillations or positive waves were seen. The medial gastrocnemius muscle reveals 1 to 3K motor units with full recruitment. No fibrillations or positive waves were seen. The vastus lateralis muscle reveals 2 to 4K motor units with full recruitment. No fibrillations or positive waves were seen. The iliopsoas muscle reveals 2 to 4K motor units with full recruitment. No fibrillations or positive waves were seen. The biceps femoris muscle (long head) reveals 2 to 4K motor units with full recruitment. No fibrillations or positive waves were seen. The lumbosacral paraspinal muscles were tested at 3  levels, and revealed no abnormalities of insertional activity at all 3 levels tested. There was good relaxation.   IMPRESSION:  Nerve conduction studies done on the right upper extremity and both lower extremities were within normal limits. No evidence of a peripheral neuropathy is seen. EMG evaluation of the left lower extremity is unremarkable without evidence of an overlying lumbosacral radiculopathy.  Marlan Palau MD 06/19/2017 3:44 PM  [facility] [address] [place], [place] [address]  Phone [phone] Fax [phone],

## 2017-06-20 ENCOUNTER — Telehealth: Payer: Self-pay

## 2017-06-20 NOTE — Telephone Encounter (Signed)
Rn call patient about his EMG, and NCV test. Rn stated per DR. Athar the patient that the recent EMG and nerve conduction velocity test, which is the electrical nerve and muscle test we we performed, was reported as within normal limits. We checked for abnormal electrical discharges in the muscles or nerves and the report suggested normal findings. No further action is required on this test at this time. Please remind patient to keep any upcoming appointments or tests and to call us with any interim questions, concerns, problems or updates. Pt verbalized understanding.

## 2017-06-20 NOTE — Telephone Encounter (Signed)
-----   Message from Hildred Alamin, RN sent at 06/20/2017 11:39 AM EDT -----   ----- Message ----- From: Huston Foley, MD Sent: 06/20/2017   7:22 AM To: Geronimo Running, RN  Please call and advise the patient that the recent EMG and nerve conduction velocity test, which is the electrical nerve and muscle test we we performed, was reported as within normal limits. We checked for abnormal electrical discharges in the muscles or nerves and the report suggested normal findings. No further action is required on this test at this time. Please remind patient to keep any upcoming appointments or tests and to call us with any interim questions, concerns, problems or updates. Thanks,  Huston Foley, MD, PhD

## 2017-06-20 NOTE — Progress Notes (Signed)
Please call and advise the patient that the recent EMG and nerve conduction velocity test, which is the electrical nerve and muscle test we we performed, was reported as within normal limits. We checked for abnormal electrical discharges in the muscles or nerves and the report suggested normal findings. No further action is required on this test at this time. Please remind patient to keep any upcoming appointments or tests and to call us with any interim questions, concerns, problems or updates. Thanks,  Anderia Lorenzo, MD, PhD 

## 2017-06-25 NOTE — Telephone Encounter (Signed)
Left vm for patient to call back about MRI results. 

## 2017-06-26 NOTE — Telephone Encounter (Signed)
Patient returning a call for MRI results. °

## 2017-06-27 NOTE — Telephone Encounter (Signed)
Waiting on authorization from insurance company

## 2017-06-27 NOTE — Telephone Encounter (Signed)
I called pt. 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 No acute strokes were seen and nothing to explain his current symptoms. I explained that there were changes in the deeper structures of his brain called white matter changes, that were mild in his case. I explained the conditions that may cause the white matter changes . I encouraged good BP, cholesterol control, blood sugar control, and weight management. Pt reports that he has not heard to schedule his sleep study yet and I will forward pt's concern to the sleep lab. Pt verbalized understanding of results.

## 2017-08-08 ENCOUNTER — Ambulatory Visit (INDEPENDENT_AMBULATORY_CARE_PROVIDER_SITE_OTHER): Payer: Federal, State, Local not specified - PPO | Admitting: Neurology

## 2017-08-08 DIAGNOSIS — R0681 Apnea, not elsewhere classified: Secondary | ICD-10-CM

## 2017-08-08 DIAGNOSIS — Z820 Family history of epilepsy and other diseases of the nervous system: Secondary | ICD-10-CM

## 2017-08-08 DIAGNOSIS — R200 Anesthesia of skin: Secondary | ICD-10-CM

## 2017-08-08 DIAGNOSIS — Z9189 Other specified personal risk factors, not elsewhere classified: Secondary | ICD-10-CM

## 2017-08-08 DIAGNOSIS — G478 Other sleep disorders: Secondary | ICD-10-CM

## 2017-08-08 DIAGNOSIS — E669 Obesity, unspecified: Secondary | ICD-10-CM

## 2017-08-08 DIAGNOSIS — G4733 Obstructive sleep apnea (adult) (pediatric): Secondary | ICD-10-CM | POA: Diagnosis not present

## 2017-08-08 DIAGNOSIS — R351 Nocturia: Secondary | ICD-10-CM

## 2017-08-08 DIAGNOSIS — R0683 Snoring: Secondary | ICD-10-CM

## 2017-08-08 DIAGNOSIS — R202 Paresthesia of skin: Secondary | ICD-10-CM

## 2017-08-13 NOTE — Progress Notes (Signed)
Patient referred by Ms. Adams, NP, seen by me on 06/07/17, split study on 08/08/17. Please call and notify patient that the recent sleep study confirmed the diagnosis of severe obstructive sleep apnea, with a total AHI of 77.6/hour, REM AHI of 67.2/hour, and O2 nadir of 50%. Treatment with positive airway pressure in the form of CPAP was not fully successful and standard BiPAP therapy was not sufficient to eliminate his sleep disordered breathing, even at a pressure of 24/20 cm, which is high. On CPAP of 16 cm, his AHI was 6.8/hour during NREM sleep with supine sleep achieved, O2 nadir of 88%. While this is not optimal, this indicates significantly improved sleep disordered breathing and I will recommend starting the patient on home CPAP of 16 cm. I would like start the patient on CPAP therapy at home by prescribing a machine for home use. I placed the order in the chart. The patient will need a follow up appointment with me in 8 to 10 weeks post set up that has to be scheduled; please go ahead and schedule while you have the patient on the phone and make sure patient understands the importance of keeping this window for the FU appointment, as it is often an insurance requirement and failing to adhere to this may result in losing coverage for sleep apnea treatment.  Please re-enforce the importance of compliance with treatment and the need for [place] to monitor compliance data - again an insurance requirement and good feedback for the patient as far as how they are doing.  Also remind patient, that any upcoming CPAP machine or mask issues, should be first addressed with the DME company. Please ask if patient has a preference regarding DME company.  Please arrange for CPAP set up at home through a DME company of patient's choice - once you have spoken to the patient - and faxed/routed report to PCP and referring MD (if other than PCP), you can close this encounter, thanks,   Huston Foley, MD, PhD [facility]

## 2017-08-13 NOTE — Procedures (Signed)
PATIENT'S NAME:  [name], [name] DOB:      10-09-1956      MR#:    [id number]     DATE OF RECORDING: 08/08/2017 REFERRING M.D.:  Laverda Sorenson, AGNP Study Performed:  Split-Night Titration Study HISTORY: 61 year old man with a history of hypertension, hyperlipidemia, reflux disease, lumbar spondylosis and left-sided low back pain with sciatica, as well as obesity, who reports snoring and witnessed apneas, sleep disruption, nocturia and a family history of OSA. The patient endorsed the Epworth Sleepiness Scale at 8 points. The patient's weight 277 pounds with a height of 71 (inches), resulting in a BMI of 38.9 kg/m2. The patient's neck circumference measured 23.5 inches.  CURRENT MEDICATIONS: Flonase, Advil, Prinivil.  PROCEDURE:  This is a multichannel digital polysomnogram utilizing the Somnostar 11.2 system.  Electrodes and sensors were applied and monitored per AASM Specifications.   EEG, EOG, Chin and Limb EMG, were sampled at 200 Hz.  ECG, Snore and Nasal Pressure, Thermal Airflow, Respiratory Effort, CPAP Flow and Pressure, Oximetry was sampled at 50 Hz. Digital video and audio were recorded.      BASELINE STUDY WITHOUT CPAP RESULTS:  Lights Out was at 22:17 and Lights On at 05:47 for the night, split start at 00:26, epoch 264. Total recording time (TRT) was 128.5, with a total sleep time (TST) of 117.5 minutes.   The patient's sleep latency was 3 minutes.  REM latency was 107 minutes.  The sleep efficiency was 91.4 %.    SLEEP ARCHITECTURE: WASO (Wake after sleep onset) was 2 minutes, Stage N1 was 2.5 minutes, Stage N2 was 44.5 minutes, Stage N3 was 58 minutes and Stage R (REM sleep) was 12.5 minutes.  The percentages were Stage N1 2.1%, Stage N2 37.9%, Stage N3 49.4% and Stage R (REM sleep) 10.6%. The arousals were noted as: 9 were spontaneous, 0 were associated with PLMs, 19 were associated with respiratory events.   Audio and video analysis did not show any abnormal or unusual movements,  behaviors, phonations or vocalizations.  The patient took no bathroom breaks. Moderate to loud snoring was noted. The EKG was in keeping with normal sinus rhythm (NSR).  RESPIRATORY ANALYSIS:  There were a total of 152 respiratory events:  67 obstructive apneas, 0 central apneas and 0 mixed apneas with a total of 67 apneas and an apnea index (AI) of 34.2. There were 85 hypopneas with a hypopnea index of 43.4. The patient also had 0 respiratory event related arousals (RERAs).  Snoring was noted.     The total APNEA/HYPOPNEA INDEX (AHI) was 77.6 /hour and the total RESPIRATORY DISTURBANCE INDEX was 77.6 /hour.  14 events occurred in REM sleep and 166 events in NREM. The REM AHI was 67.2, /hour versus a non-REM AHI of 78.9 /hour. The patient spent 422.5 minutes sleep time in the supine position 0 minutes in non-supine. The supine AHI was 77.6 /hour versus a non-supine AHI of n/a.  OXYGEN SATURATION & C02:  The wake baseline 02 saturation was 94%, with the lowest being 50%. Time spent below 89% saturation equaled 62 minutes.  PERIODIC LIMB MOVEMENTS: The patient had a total of 0 Periodic Limb Movements.  The Periodic Limb Movement (PLM) index was 0 /hour and the PLM Arousal index was 0 /hour.  TITRATION STUDY WITH CPAP RESULTS:   The patient was fitted with a Simplus FFM, size medium. CPAP was initiated at 5 cmH20 with heated humidity per AASM split night standards and pressure was advanced to  16 cmH20 because of hypopneas, apneas and desaturations. At that point, his AHI was 6.8/hour, O2 nadir of 88% during supine NREM sleep. He was tried on BiPAP of 20/16 cm and further titrated to 24/20 cm, but his AHI was high, between 38.8/hour to 67.8/hour while on standard BiPAPl. He may need to return for a full night BiPAP vs. ASV titration.   Total recording time (TRT) was 322 minutes, with a total sleep time (TST) of 305 minutes. The patient's sleep latency was 6.5 minutes. REM latency was 69 minutes.  The  sleep efficiency was 94.7 %.    SLEEP ARCHITECTURE: Wake after sleep was 9.5 minutes, Stage N1 18 minutes, Stage N2 157 minutes, Stage N3 60 minutes and Stage R (REM sleep) 70 minutes. The percentages were: Stage N1 5.9%, Stage N2 51.5%, Stage N3 19.7% and Stage R (REM sleep) 23.%.   The arousals were noted as: 26 were spontaneous, 0 were associated with PLMs, 9 were associated with respiratory events.  RESPIRATORY ANALYSIS:  There were a total of 164 respiratory events: 104 obstructive apneas, 0 central apneas and 0 mixed apneas with a total of 104 apneas and an apnea index (AI) of 20.5. There were 60 hypopneas with a hypopnea index of 11.8 /hour. The patient also had 0 respiratory event related arousals (RERAs).      The total APNEA/HYPOPNEA INDEX  (AHI) was 32.3 /hour and the total RESPIRATORY DISTURBANCE INDEX was 32.3 /hour.  55 events occurred in REM sleep and 109 events in NREM. The REM AHI was 47.1 /hour versus a non-REM AHI of 27.8 /hour. REM sleep was achieved on a pressure of  cm/h2o (AHI was  .) The patient spent 100% of total sleep time in the supine position. The supine AHI was 32.3 /hour, versus a non-supine AHI of 0.0/hour.  OXYGEN SATURATION & C02:  The wake baseline 02 saturation was 96%, with the lowest being 58%. Time spent below 89% saturation equaled 31 minutes.  PERIODIC LIMB MOVEMENTS: The patient had a total of 0 Periodic Limb Movements. The Periodic Limb Movement (PLM) index was 0 /hour and the PLM Arousal index was 0 /hour.  Post-study, the patient indicated that sleep was the same as usual.  POLYSOMNOGRAPHY IMPRESSION :   1. Obstructive Sleep Apnea (OSA)   RECOMMENDATIONS:  1. This study demonstrates severe obstructive sleep apnea, with a total AHI of 77.6/hour, REM AHI of 67.2/hour, and O2 nadir of 50%. Treatment with positive airway pressure in the form of CPAP was not fully successful and standard BiPAP therapy was not sufficient to eliminate his sleep disordered  breathing, even at a pressure of 24/20 cm, which is high. His AHI was 6.8/hour during NREM sleep with supine sleep achieved, O2 nadir of 88%. While this is not optimal, this indicates significantly improved sleep disordered breathing and I will recommend starting the patient on home CPAP of 16 cm via medium FFM, with heated humidity. The patient should be reminded to be fully compliant with PAP therapy to improve sleep related symptoms and decrease long term cardiovascular risks.  2. Please note that untreated obstructive sleep apnea carries additional perioperative morbidity. Patients with significant obstructive sleep apnea should receive perioperative PAP therapy and the surgeons and particularly the anesthesiologist should be informed of the diagnosis and the severity of the sleep disordered breathing. Ultimately, he may benefit from a full night titration study with BiPAP, BiPAP ST and consideration of ASV (adaptive servo ventilation).  3. The patient should be cautioned not  to drive, work at heights, or operate dangerous or heavy equipment when tired or sleepy. Review and reiteration of good sleep hygiene measures should be pursued with any patient. 4. The patient will be seen in follow-up by Dr. Frances Furbish at [facility] for discussion of the test results and further management strategies. The referring provider will be notified of the test results.  I certify that I have reviewed the entire raw data recording prior to the issuance of this report in accordance with the Standards of Accreditation of the American Academy of Sleep Medicine (AASM)    Huston Foley, MD, PhD Diplomat, American Board of Psychiatry and Neurology (Neurology and Sleep Medicine)

## 2017-08-13 NOTE — Addendum Note (Signed)
Addended by: Huston Foley on: 08/13/2017 03:25 PM   Modules accepted: Orders

## 2017-08-14 ENCOUNTER — Telehealth: Payer: Self-pay | Admitting: Neurology

## 2017-08-14 NOTE — Telephone Encounter (Signed)
-----   Message from Huston Foley, MD sent at 08/13/2017  3:25 PM EDT ----- Patient referred by Ms. Adams, NP, seen by me on 06/07/17, split study on 08/08/17. Please call and notify patient that the recent sleep study confirmed the diagnosis of severe obstructive sleep apnea, with a total AHI of 77.6/hour, REM AHI of 67.2/hour, and O2 nadir of 50%. Treatment with positive airway pressure in the form of CPAP was not fully successful and standard BiPAP therapy was not sufficient to eliminate his sleep disordered breathing, even at a pressure of 24/20 cm, which is high. On CPAP of 16 cm, his AHI was 6.8/hour during NREM sleep with supine sleep achieved, O2 nadir of 88%. While this is not optimal, this indicates significantly improved sleep disordered breathing and I will recommend starting the patient on home CPAP of 16 cm. I would like start the patient on CPAP therapy at home by prescribing a machine for home use. I placed the order in the chart. The patient will need a follow up appointment with me in 8 to 10 weeks post set up that has to be scheduled; please go ahead and schedule while you have the patient on the phone and make sure patient understands the importance of keeping this window for the FU appointment, as it is often an insurance requirement and failing to adhere to this may result in losing coverage for sleep apnea treatment.  Please re-enforce the importance of compliance with treatment and the need for [place] to monitor compliance data - again an insurance requirement and good feedback for the patient as far as how they are doing.  Also remind patient, that any upcoming CPAP machine or mask issues, should be first addressed with the DME company. Please ask if patient has a preference regarding DME company.  Please arrange for CPAP set up at home through a DME company of patient's choice - once you have spoken to the patient - and faxed/routed report to PCP and referring MD (if other than PCP), you can  close this encounter, thanks,   Huston Foley, MD, PhD [facility] ([facility])

## 2017-08-14 NOTE — Telephone Encounter (Signed)
I called pt. I advised pt that Dr. Frances Furbish reviewed their sleep study results and found that pt severe sleep apnea. Dr. Frances Furbish recommends that pt starts CPAP. I reviewed PAP compliance expectations with the pt. Pt is agreeable to starting a CPAP. I advised pt that an order will be sent to a DME, [facility], and [facility] will call the pt within about one week after they file with the pt's insurance. [facility] will show the pt how to use the machine, fit for masks, and troubleshoot the CPAP if needed. A follow up appt was made for insurance purposes with Dr. Frances Furbish on Nov 01 2016 at 3:00 pm. Pt verbalized understanding to arrive 15 minutes early and bring their CPAP. A letter with all of this information in it will be mailed to the pt as a reminder. I verified with the pt that the address we have on file is correct. Pt verbalized understanding of results. Pt had no questions at this time but was encouraged to call back if questions arise.

## 2017-10-31 ENCOUNTER — Telehealth: Payer: Self-pay

## 2017-10-31 NOTE — Telephone Encounter (Signed)
I called pt. I advised him that Dr. Frances FurbishAthar will be out of the office tomorrow afternoon and that his appt will need to be rescheduled. Pt is agreeable to an 8:30am appt on 11/01/17 instead, and verbalized understanding to bring his cpap machine.

## 2017-11-01 ENCOUNTER — Ambulatory Visit: Payer: Federal, State, Local not specified - PPO | Admitting: Neurology

## 2017-11-01 ENCOUNTER — Ambulatory Visit: Payer: Self-pay | Admitting: Neurology

## 2017-11-01 ENCOUNTER — Encounter: Payer: Self-pay | Admitting: Neurology

## 2017-11-01 VITALS — BP 168/78 | HR 60 | Ht 71.0 in | Wt 281.0 lb

## 2017-11-01 DIAGNOSIS — Z9989 Dependence on other enabling machines and devices: Secondary | ICD-10-CM

## 2017-11-01 DIAGNOSIS — G4733 Obstructive sleep apnea (adult) (pediatric): Secondary | ICD-10-CM

## 2017-11-01 NOTE — Progress Notes (Signed)
Subjective:    Patient ID: [name] is a 62 y.o. male.  HPI     Interim history:   [name] is a 62 year old right-handed gentleman with an underlying medical history of hypertension, hyperlipidemia, reflux disease, lumbar spondylosis and left-sided low back pain with sciatica, as well as obesity, who presents for follow-up consultation of his obstructive sleep apnea, after recent sleep study testing as well as a history of heat exhaustion after which he was referred for neurologic consultation. I first met him on 06/07/2017 at which time he reported a recent emergency room visit where he was diagnosed with heat exhaustion. I suggested we proceed with additional testing in the form of EMG and nerve conduction testing as well as brain MRI. He also had symptoms concerning for sleep apnea. I suggested sleep study testing.   He had a brain MRI without contrast on which I reviewed: IMPRESSION:  This MRI of the brain without contrast shows the following: 1.    Several small chronic lacunar infarctions involving the basal ganglia and anterior internal capsule on the right. 2.    Scattered T2/FLAIR hyperintense foci consistent with chronic microvascular ischemic changes. 3.    Mild left mastoid effusion, probably due to mild eustachian tube dysfunction.  4.    There are no acute findings.  We called him with his test results.   He had an EMG nerve conduction test on 06/19/2017 which I reviewed: IMPRESSION:   Nerve conduction studies done on the right upper extremity and both lower extremities were within normal limits. No evidence of a peripheral neuropathy is seen. EMG evaluation of the left lower extremity is unremarkable without evidence of an overlying lumbosacral radiculopathy.  We called him with his test results.  He had a split-night sleep study on 08/08/2017. I went over his test results with him in detail today. Baseline sleep efficiency was 91.4%, sleep latency 3 minutes, REM  latency normal at 107 minutes. He had moderate to loud snoring. REM sleep was 10.6%, slow-wave sleep was increased at 49.4%. Total AHI was 77.6 per hour. Average oxygen saturation was 94%, nadir was 50%. Time below or at 88% saturation was 62 minutes. He was sent fitted with a full face mask and CPAP was titrated from 5 cm to 16 cm. He was also tried on BiPAP therapy. On a CPAP pressure of 16 cm his AHI was 6.8 per hour. Based on these test results I prescribed CPAP therapy for home use at a pressure of 16 cm.  Today, 11/01/2017: I reviewed his CPAP compliance data from 10/01/2017 through 10/30/2017 which is a total of 30 days, during which time he used his CPAP 26 days with percent used days greater than 4 hours at 33% only, indicating low compliance with an average usage of 3 hours and 34 minutes, residual AHI suboptimal at 9.7 per hour but overall much improved compared to his sleep study. Leak high with the 95th percentile at 49.5 L/m on a pressure of 16 cm with EPR of 3. In the past 65 days his compliance percentage overall for more than 4 hours of usage was 46%, again suboptimal. He reports having a difficult time adjusting to CPAP. He feels that he can only sleep on his back with the current mask he has. He is trying to be compliant. He is also working on weight loss. Over the holidays he has gained weight however. In the past year or so he has gained quite a bit of  weights in the realm of 40 pounds he estimates. He saw a [facility] specialist at [name] and had additional testing, he was advised to start using compression stockings. He has a follow-up appointment next week.  Previously:   06/07/2017: (He) was recently diagnosed with heat exhaustion. He presented to the emergency room on 05/25/2017 after working in a truck and feeling lightheaded, then developed bilateral hand tingling and numbness. He was brought in by EMS. Patient reported that he had been drinking water. He also reported a  prior episode of heat exhaustion in 2016. In the emergency room his vital signs are stable, temperature was 98.6. He was treated symptomatically with Reglan, IV fluids, Benadryl, and neurological exam was nonfocal at the time.  He had a head CT without contrast on 05/25/2017 which I reviewed: IMPRESSION: 1. Negative for bleed or other acute intracranial process.  I reviewed your office note from 05/29/2017. You ordered a repeat CMP. CMP was unremarkable on 05/25/2017 with the exception of slightly reduced total protein at 6.2. He reports that he has had ongoing numbness and tingling in his right hand and below the knees bilaterally. He feels weaker overall, lack of energy, not sleeping well since his ER visit. He feels like his left upper extremity has recovered to normalcy. He has not fallen. He works for the [employer]. He has a history of snoring. Girlfriend has reported apneic pauses to him while asleep. He has had more sleep disruption lately. He has nocturia about once per average night. He drinks caffeine in the form of coffee, usually one cup per day and tea, 1-2 glasses per day. He is a nonsmoker. He drinks alcohol occasionally, typically on weekends. He lives with his girlfriend. He feels like his legs feel cold all the time. He has a family history of obstructive sleep apnea in his mother who uses a CPAP machine. Mother has circulation problems. Brother has diabetes. CK on 05/25/17 was 97.   His Past Medical History Is Significant For: Past Medical History:  Diagnosis Date  . Chronic left-sided low back pain with left-sided sciatica   . Esophageal reflux   . Hyperlipidemia   . Hypertension   . Irritable colon   . Lumbar spondylosis     His Past Surgical History Is Significant For: No past surgical history on file.  His Family History Is Significant For: Family History  Problem Relation Age of Onset  . Diabetes Mother   . Heart disease Mother   . Parkinson's disease Father    . Heart disease Father   . Diabetes Father     His Social History Is Significant For: Social History   Socioeconomic History  . Marital status: Single    Spouse name: None  . Number of children: None  . Years of education: None  . Highest education level: None  Social Needs  . Financial resource strain: None  . Food insecurity - worry: None  . Food insecurity - inability: None  . Transportation needs - medical: None  . Transportation needs - non-medical: None  Occupational History  . None  Tobacco Use  . Smoking status: Never Smoker  . Smokeless tobacco: Current User    Types: Snuff  Substance and Sexual Activity  . Alcohol use: No  . Drug use: No  . Sexual activity: None  Other Topics Concern  . None  Social History Narrative  . None    His Allergies Are:  No Known Allergies:   His Current Medications  Are:  Outpatient Encounter Medications as of 11/01/2017  Medication Sig  . fluticasone (FLONASE) 50 MCG/ACT nasal spray Place into both nostrils daily.  Marland Kitchen ibuprofen (ADVIL,MOTRIN) 200 MG tablet Take 200 mg by mouth every 6 (six) hours as needed (for pain).  Marland Kitchen lisinopril (PRINIVIL,ZESTRIL) 10 MG tablet Take 10 mg by mouth daily.   No facility-administered encounter medications on file as of 11/01/2017.   :  Review of Systems:  Out of a complete 14 point review of systems, all are reviewed and negative with the exception of these symptoms as listed below:  Review of Systems  Neurological:       Pt presents today to follow up on his cpap. Pt has a hard time sleeping supine.    Objective:  Neurological Exam  Physical Exam Physical Examination:   Vitals:   11/01/17 0834  BP: (!) 168/78  Pulse: 60   General Examination: The patient is a very pleasant 62 y.o. male in no acute distress. He appears well-developed and well-nourished and well groomed.   HEENT: Normocephalic, atraumatic, pupils are equal, round and reactive to light and accommodation. Funduscopic  exam is normal with sharp disc margins noted. Extraocular tracking is good without limitation to gaze excursion or nystagmus noted. Normal smooth pursuit is noted. Hearing is grossly intact. Face is symmetric with normal facial animation and normal facial sensation. Speech is clear with no dysarthria noted. There is no hypophonia. There is no lip, neck/head, jaw or voice tremor. Neck is supple with full range of passive and active motion. There are no carotid bruits on auscultation. Oropharynx exam reveals: mild mouth dryness, adequate dental hygiene and marked airway crowding, uvula is mildly erythematous. Mallampati is class III. Tongue protrudes centrally and palate elevates symmetrically.   Chest: Clear to auscultation without wheezing, rhonchi or crackles noted.  Heart: S1+S2+0, regular and normal without murmurs, rubs or gallops noted.   Abdomen: Soft, non-tender and non-distended with normal bowel sounds appreciated on auscultation.  Extremities: There is no pitting edema in the distal lower extremities bilaterally.   Skin: Warm and dry without trophic changes noted. There are no varicose veins, mild erythematous appearance of both hands and distal legs.  Musculoskeletal: exam reveals no obvious joint deformities, tenderness or joint swelling or erythema.   Neurologically:  Mental status: The patient is awake, alert and oriented in all 4 spheres. His immediate and remote memory, attention, language skills and fund of knowledge are appropriate. There is no evidence of aphasia, agnosia, apraxia or anomia. Speech is clear with normal prosody and enunciation. Thought process is linear. Mood is normal and affect is normal.  Cranial nerves II - XII are as described above under HEENT exam. In addition: shoulder shrug is normal with equal shoulder height noted. Motor exam: Normal bulk, strength and tone is noted. There is no drift, tremor or rebound. Romberg is negative. Reflexes are 2+  throughout. Fine motor skills and coordination: intact with normal finger taps, normal hand movements, normal rapid alternating patting, normal foot taps and normal foot agility.  Cerebellar testing: No dysmetria or intention tremor on finger to nose testing. Heel to shin is unremarkable bilaterally. There is no truncal or gait ataxia.  Sensory exam: intact to light touch in the UEs and LEs.  Gait, station and balance: He stands easily. No veering to one side is noted. No leaning to one side is noted. Posture is age-appropriate and stance is narrow based. Gait shows normal stride length and normal pace. No problems  turning are noted. Tandem walk is unremarkable.  Assessment and plan:  In summary, Jaquail A Lant is a very pleasant 62 year old male with an underlying medical history of hypertension, hyperlipidemia, reflux disease, lumbar spondylosis and left-sided low back pain with sciatica, as well as obesity, who  presents for follow-up consultation of his obstructive sleep apnea. He was also seen after his emergency room visit for heat exhaustion. Neurologically he was nonfocal and continues to have a nonfocal exam, thankfully. Workup in the emergency room was benign and subsequent workup with [place] including brain MRI and EMG testing showed no acute abnormality. He had evidence of prior lacunar strokes. He does have vascular risk factors. We talked about vascular risk factors today. He is advised to continue to work on weight loss. He is advised to continue to be fully compliant with CPAP. He's not quite fully compliant yet. He is advised to meet with our sleep lab manager on his way out to see if we can help him with his mask issues, provide a different mask to try out at home. I will see him back in 3 months at which time we will think about bringing him back for a full night titration study to optimize treatment. He has evidence of significant sleep apnea which would benefit from full treatment. Long-term  risks were discussed with him as well.  He is in agreement with the plan. I answered all his questions today.  I spent 30 minutes in total face-to-face time with the patient, more than 50% of which was spent in counseling and coordination of care, reviewing test results, reviewing medication and discussing or reviewing the diagnosis of OSA, its prognosis and treatment options. Pertinent laboratory and imaging test results that were available during this visit with the patient were reviewed by me and considered in my medical decision making (see chart for details).

## 2017-11-01 NOTE — Patient Instructions (Addendum)
We may consider a repeat sleep study in the near future to optimize treatment. In the interim, we will have you meet with Zella Ballobin, our sleep lab manager on your way out today, for a mask fitting appointment.  So far, you have tried very well with you CPAP.   Please continue using your CPAP regularly. While your insurance requires that you use CPAP at least 4 hours each night on 70% of the nights, I recommend, that you not skip any nights and use it throughout the night if you can. Getting used to CPAP and staying with the treatment long term does take time and patience and discipline. Untreated obstructive sleep apnea when it is moderate to severe can have an adverse impact on cardiovascular health and raise her risk for heart disease, arrhythmias, hypertension, congestive heart failure, stroke and diabetes. Untreated obstructive sleep apnea causes sleep disruption, nonrestorative sleep, and sleep deprivation. This can have an impact on your day to day functioning and cause daytime sleepiness and impairment of cognitive function, memory loss, mood disturbance, and problems focussing. Using CPAP regularly can improve these symptoms.

## 2017-12-31 ENCOUNTER — Encounter: Payer: Self-pay | Admitting: Neurology

## 2018-02-05 ENCOUNTER — Ambulatory Visit: Payer: Federal, State, Local not specified - PPO | Admitting: Neurology

## 2018-03-20 IMAGING — CT CT HEAD W/O CM
4 series · 16 of 47 positions shown, 18 images · non-contrast
Comparison: 04/05/1999 by report only

CLINICAL DATA: Headache, R sided numbness Pt Ratearena he had a heat
stroke today Denies LOC No hx of strokes Hx of HTN

EXAM:
CT HEAD WITHOUT CONTRAST
TECHNIQUE: Contiguous axial images were obtained from the base of the skull
through the vertex without intravenous contrast.

[Series 3: head without · axial · non-contrast · 0.50mm/px · z∈[+1325,+1445]mm · 7 of 34 slices shown, 9 images]
[im 5/34  brain]
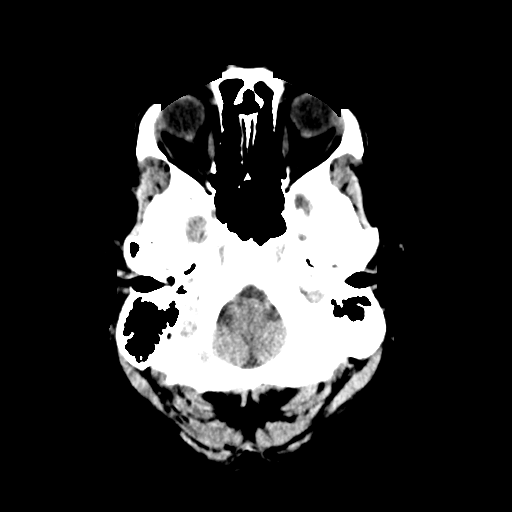
[im 5/34  bone]
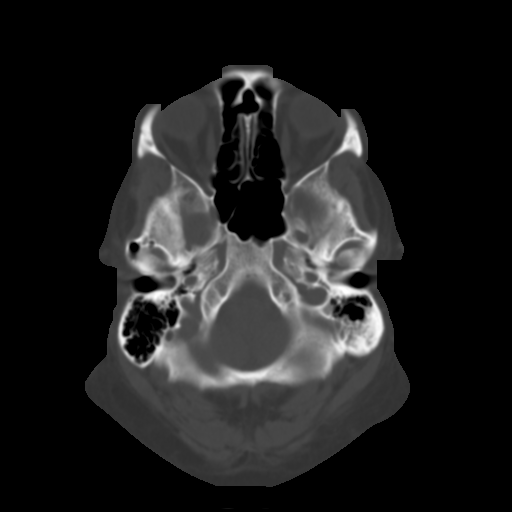
[im 9/34  brain]
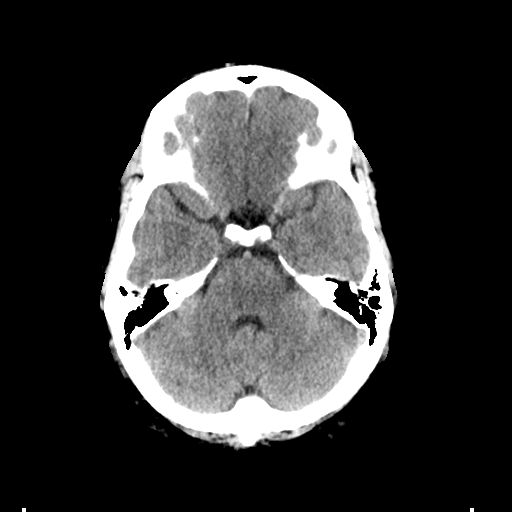
[im 13/34  brain]
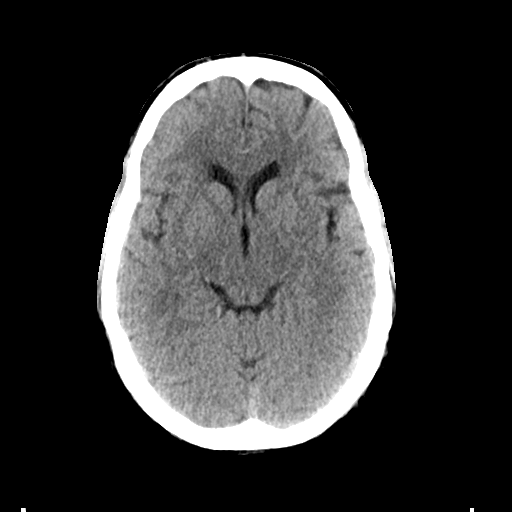
[im 17/34  brain]
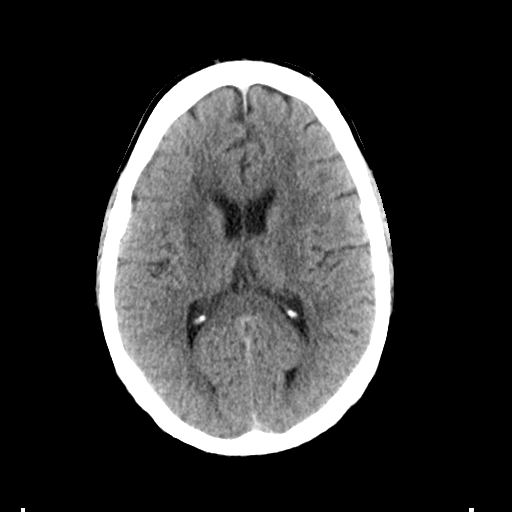
[im 21/34  brain]
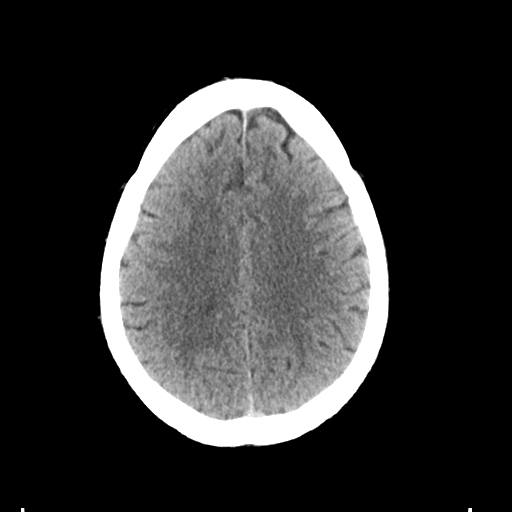
[im 21/34  bone]
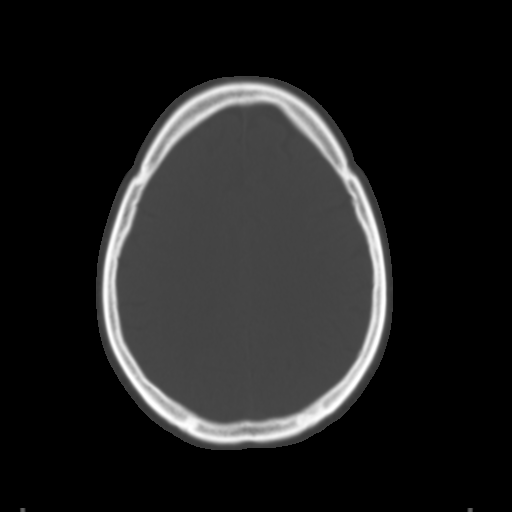
[im 25/34  brain]
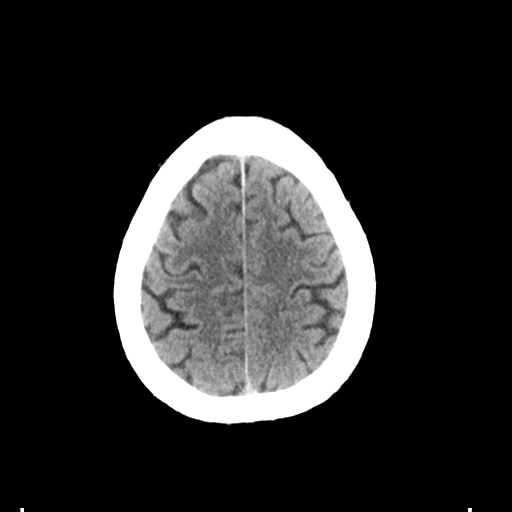
[im 29/34  brain]
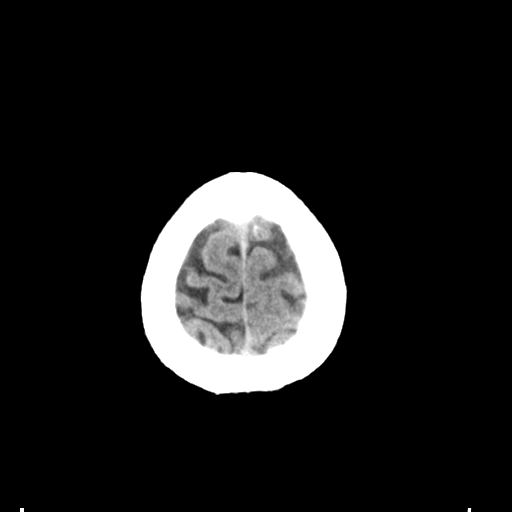

[Series 4: head bone · axial · 0.50mm/px · z∈[+1321,+1355]mm · 3 of 85 slices shown]
[im 9/85  bone]
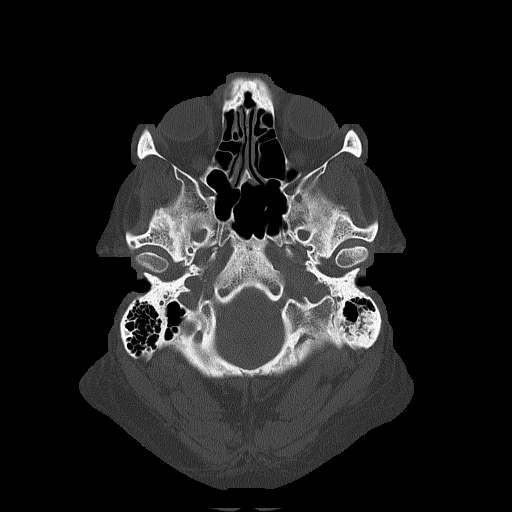
[im 17/85  bone]
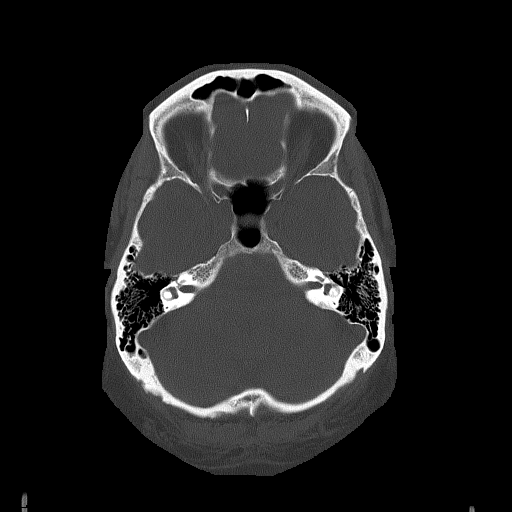
[im 26/85  bone]
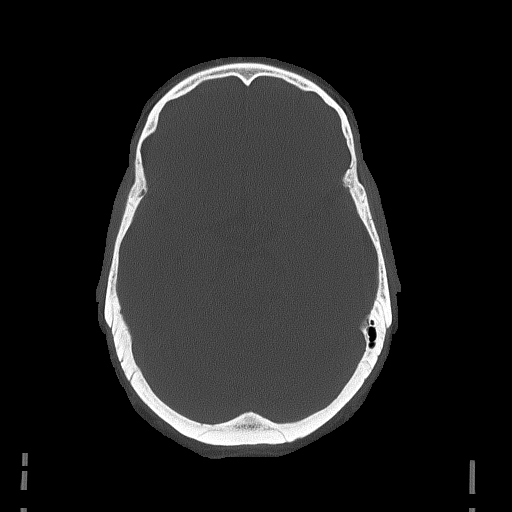

[Series 5: head without cor · coronal · non-contrast · 0.35mm/px · 3 of 77 slices shown]
[im 26/77  brain]
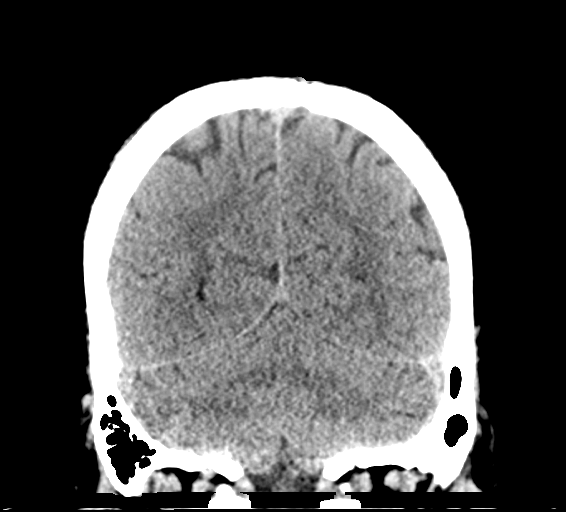
[im 34/77  brain]
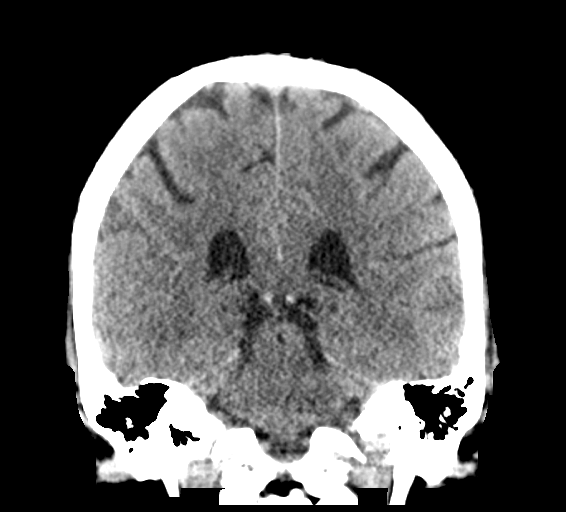
[im 43/77  brain]
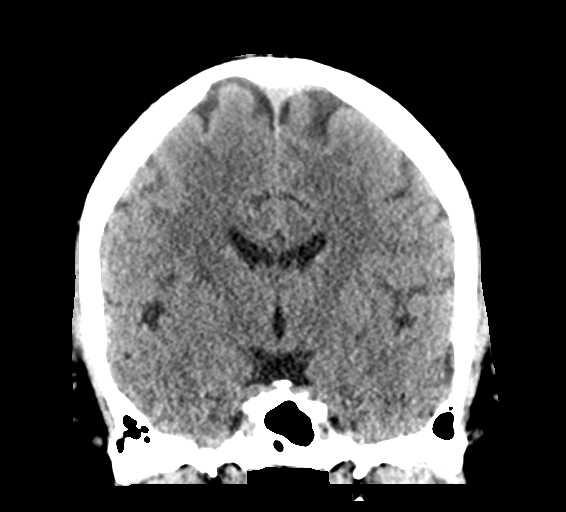

[Series 6: head without sag · sagittal · non-contrast · 0.35mm/px · 3 of 67 slices shown]
[im 23/67  brain]
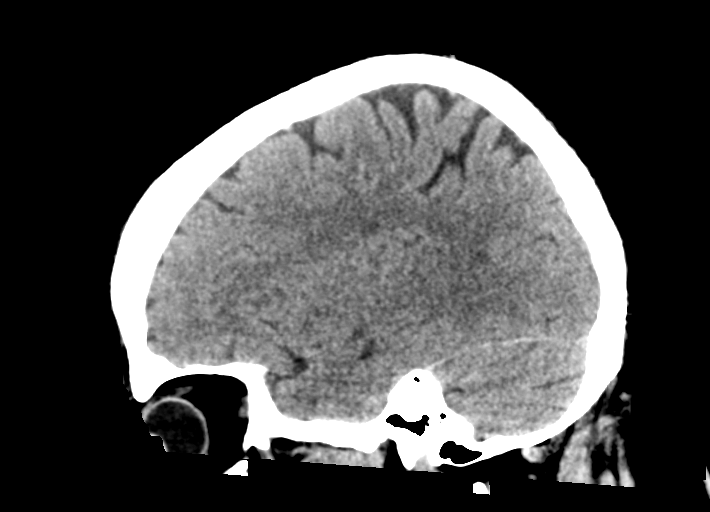
[im 34/67  brain]
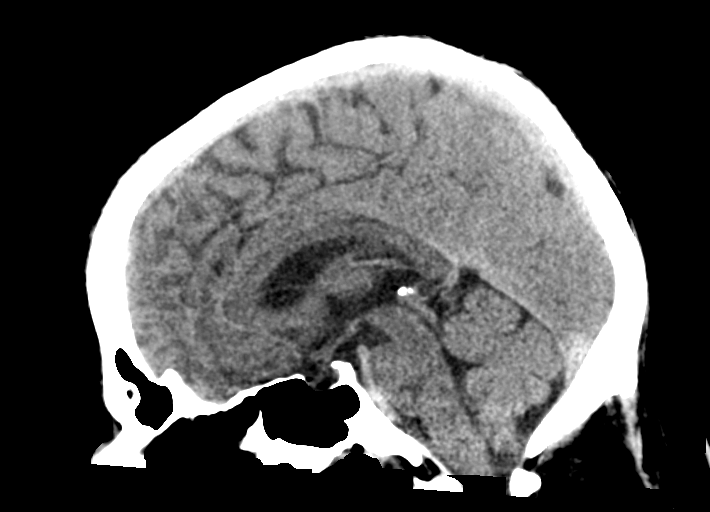
[im 45/67  brain]
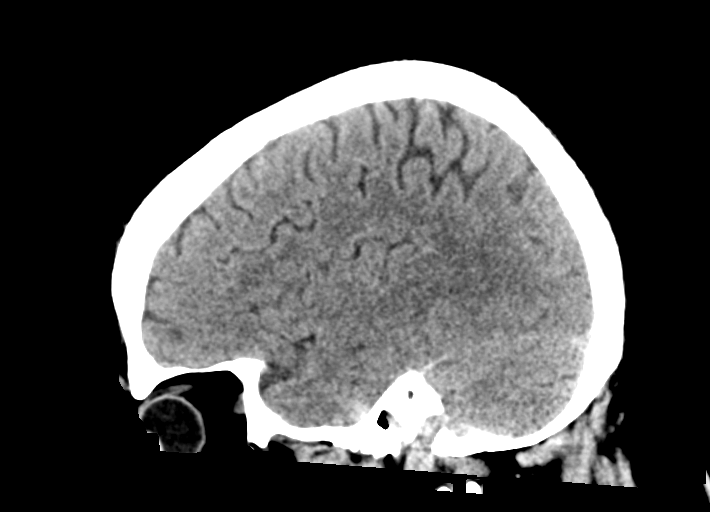

[16 of 47 positions shown; findings below may reference images not displayed]

FINDINGS: Brain: No evidence of acute infarction, hemorrhage, hydrocephalus,
extra-axial collection or mass lesion/mass effect.

Vascular: No hyperdense vessel or unexpected calcification.

Skull: Normal. Negative for fracture or focal lesion.

Sinuses/Orbits: No acute finding.

Other: None.
IMPRESSION: 1. Negative for bleed or other acute intracranial process

## 2018-03-20 IMAGING — CR DG CHEST 2V
2 series · 2 of 2 positions shown · non-contrast
Comparison: 11/19/2015

CLINICAL DATA: Headache, right-sided numbness

EXAM:
CHEST  2 VIEW

[chest lat]
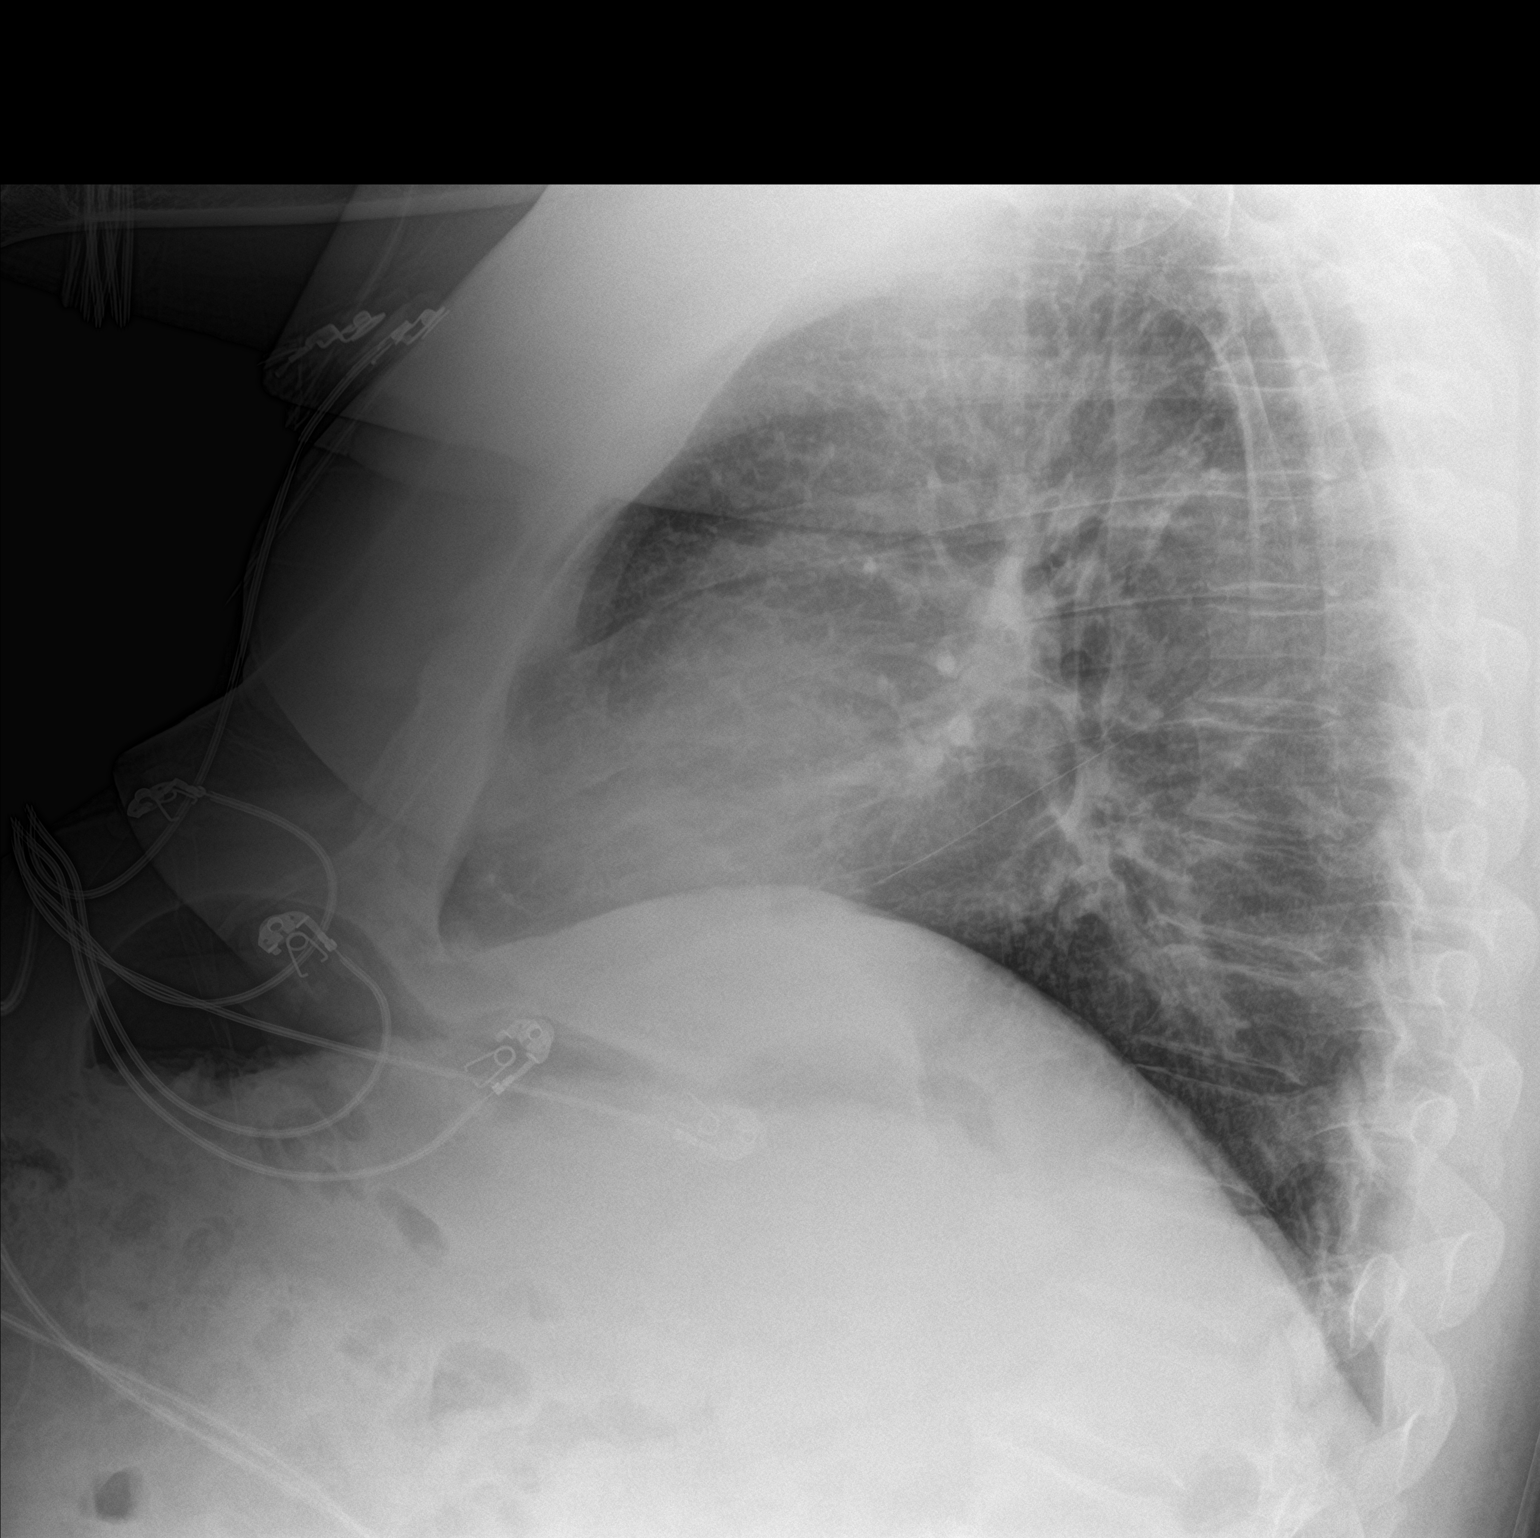

[chest ap]
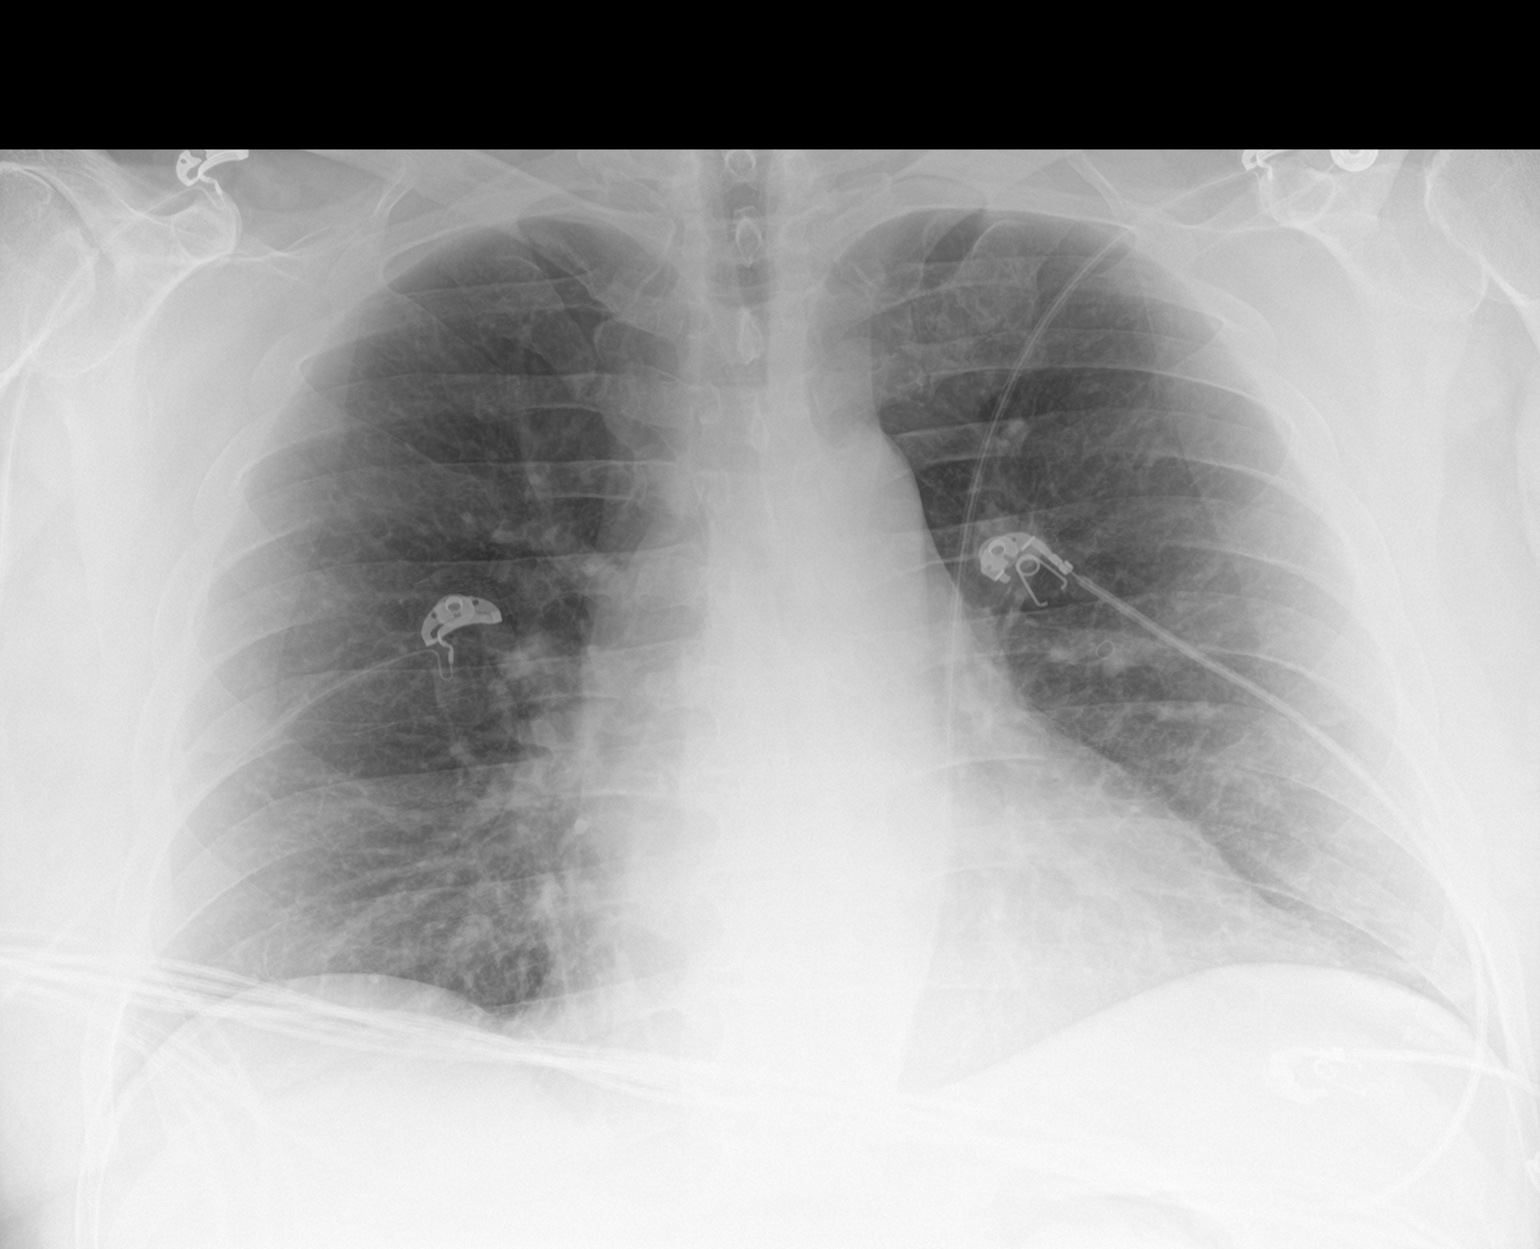

[2 of 2 positions shown; findings below may reference images not displayed]

FINDINGS: The heart size and mediastinal contours are within normal limits.
Both lungs are clear. The visualized skeletal structures are
unremarkable.
IMPRESSION: No active cardiopulmonary disease.

## 2018-05-13 ENCOUNTER — Emergency Department (HOSPITAL_COMMUNITY)
Admission: EM | Admit: 2018-05-13 | Discharge: 2018-05-13 | Disposition: A | Discharge status: Home / Self Care | Payer: Federal, State, Local not specified - PPO | Attending: Emergency Medicine | Admitting: Emergency Medicine

## 2018-05-13 ENCOUNTER — Encounter (HOSPITAL_COMMUNITY): Payer: Self-pay | Admitting: Emergency Medicine

## 2018-05-13 ENCOUNTER — Other Ambulatory Visit: Payer: Self-pay

## 2018-05-13 DIAGNOSIS — Z79899 Other long term (current) drug therapy: Secondary | ICD-10-CM | POA: Diagnosis not present

## 2018-05-13 DIAGNOSIS — T675XXA Heat exhaustion, unspecified, initial encounter: Secondary | ICD-10-CM | POA: Insufficient documentation

## 2018-05-13 DIAGNOSIS — I10 Essential (primary) hypertension: Secondary | ICD-10-CM | POA: Insufficient documentation

## 2018-05-13 LAB — COMPREHENSIVE METABOLIC PANEL
ALT: 21 U/L (ref 0–44)
ANION GAP: 6 (ref 5–15)
AST: 24 U/L (ref 15–41)
Albumin: 3.8 g/dL (ref 3.5–5.0)
Alkaline Phosphatase: 68 U/L (ref 38–126)
BUN: 14 mg/dL (ref 6–20)
CHLORIDE: 105 mmol/L (ref 98–111)
CO2: 26 mmol/L (ref 22–32)
Calcium: 8.8 mg/dL — ABNORMAL LOW (ref 8.9–10.3)
Creatinine, Ser: 0.9 mg/dL (ref 0.61–1.24)
Glucose, Bld: 85 mg/dL (ref 70–99)
POTASSIUM: 4.4 mmol/L (ref 3.5–5.1)
SODIUM: 137 mmol/L (ref 135–145)
TOTAL PROTEIN: 6.5 g/dL (ref 6.5–8.1)
Total Bilirubin: 1.4 mg/dL — ABNORMAL HIGH (ref 0.3–1.2)

## 2018-05-13 LAB — I-STAT TROPONIN, ED: TROPONIN I, POC: 0 ng/mL (ref 0.00–0.08)

## 2018-05-13 LAB — CBC WITH DIFFERENTIAL/PLATELET
ABS IMMATURE GRANULOCYTES: 0 10*3/uL (ref 0.0–0.1)
BASOS ABS: 0.1 10*3/uL (ref 0.0–0.1)
BASOS PCT: 1 %
EOS ABS: 0.2 10*3/uL (ref 0.0–0.7)
EOS PCT: 3 %
HCT: 48.7 % (ref 39.0–52.0)
Hemoglobin: 15.1 g/dL (ref 13.0–17.0)
Immature Granulocytes: 0 %
LYMPHS ABS: 1.3 10*3/uL (ref 0.7–4.0)
LYMPHS PCT: 23 %
MCH: 26.8 pg (ref 26.0–34.0)
MCHC: 31 g/dL (ref 30.0–36.0)
MCV: 86.5 fL (ref 78.0–100.0)
MONOS PCT: 14 %
Monocytes Absolute: 0.8 10*3/uL (ref 0.1–1.0)
NEUTROS PCT: 59 %
Neutro Abs: 3.4 10*3/uL (ref 1.7–7.7)
PLATELETS: 162 10*3/uL (ref 150–400)
RBC: 5.63 MIL/uL (ref 4.22–5.81)
RDW: 13.2 % (ref 11.5–15.5)
WBC: 5.8 10*3/uL (ref 4.0–10.5)

## 2018-05-13 MED ORDER — SODIUM CHLORIDE 0.9 % IV BOLUS
1000.0000 mL | Freq: Once | INTRAVENOUS | Status: AC
  Administered 2018-05-13: 1000 mL via INTRAVENOUS

## 2018-05-13 NOTE — Discharge Instructions (Addendum)
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  Follow-up with your PCP regarding the CA-17 paperwork. They should be able to see your ED hospital records in the electronic records.

## 2018-05-13 NOTE — ED Triage Notes (Signed)
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 Chest tightness. A/O

## 2018-05-13 NOTE — ED Notes (Signed)
Got patient in the bed on the monitor patient is resting with family at bedside and call bell in reach

## 2018-05-13 NOTE — ED Provider Notes (Signed)
[name] Provider Note  CSN: [id number] Arrival date & time: 05/13/18  1052  History   Chief Complaint Chief Complaint  Patient presents with  . Heat Exposure    HPI [name] is a 62 y.o. male with a medical history of HTN, HLD and heat exhaustion who presented to the ED for heat exhaustion. 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 He states that these symptoms have resolved since coming to the ED. Patient states that he has had 2 other episodes of symptoms like this year where he did not go to a medical provider. Patient currently endorsing fatigue. Denies SOB, palpitations, wheezing, N/V, vision changes, headache, facial droop or slurred speech during this episode today. He states that his truck does not have A/C and he spends 6-8 hours daily outside.  Additional history obtained by medical chart. Patient treated for heat exhaustion in the ED in 04/2017.  Past Medical History:  Diagnosis Date  . Chronic left-sided low back pain with left-sided sciatica   . Esophageal reflux   . Hyperlipidemia   . Hypertension   . Irritable colon   . Lumbar spondylosis     Patient Active Problem List   Diagnosis Date Noted  . Paresthesia 06/19/2017    History reviewed. No pertinent surgical history.      Home Medications    Prior to Admission medications   Medication Sig Start Date End Date Taking? Authorizing Provider  fluticasone (FLONASE) 50 MCG/ACT nasal spray Place into both nostrils daily.    [provider]  ibuprofen (ADVIL,MOTRIN) 200 MG tablet Take 200 mg by mouth every 6 (six) hours as needed (for pain).    [provider]  lisinopril (PRINIVIL,ZESTRIL) 10 MG tablet Take 10 mg by mouth daily.    [provider]    Family History Family History  Problem Relation Age of Onset  . Diabetes Mother     . Heart disease Mother   . Parkinson's disease Father   . Heart disease Father   . Diabetes Father     Social History Social History   Tobacco Use  . Smoking status: Never Smoker  . Smokeless tobacco: Current User    Types: Snuff  Substance Use Topics  . Alcohol use: No  . Drug use: No     Allergies   Patient has no known allergies.   Review of Systems Review of Systems  Constitutional: Positive for fatigue. Negative for chills, diaphoresis and fever.  HENT: Negative.   Eyes: Negative for pain and visual disturbance.  Respiratory: Positive for chest tightness. Negative for shortness of breath and wheezing.   Cardiovascular: Negative for chest pain, palpitations and leg swelling.  Gastrointestinal: Negative for nausea and vomiting.  Musculoskeletal: Negative.   Skin: Negative for color change and pallor.  Neurological: Positive for dizziness and numbness. Negative for tremors, facial asymmetry, speech difficulty, weakness, light-headedness and headaches.  Psychiatric/Behavioral: Negative for confusion and decreased concentration.     Physical Exam Updated Vital Signs BP 124/70   Pulse (!) 58   Temp 98 F (36.7 C) (Oral)   Resp (!) 8   Ht 5\' 11"  (1.803 m)   Wt 124.7 kg (275 lb)   SpO2 100%   BMI 38.35 kg/m   Physical Exam  Constitutional: He is oriented to person, place, and time. He appears well-developed and well-nourished. No distress.  Eyes:  Pupils are equal, round, and reactive to light. Conjunctivae and EOM are normal.  Neck: Normal range of motion. Neck supple.  Cardiovascular: Normal rate, regular rhythm, normal heart sounds and intact distal pulses.  Pulses:      Dorsalis pedis pulses are 2+ on the right side, and 2+ on the left side.       Posterior tibial pulses are 2+ on the right side, and 2+ on the left side.  Pulmonary/Chest: Effort normal and breath sounds normal.  Musculoskeletal: Normal range of motion. He exhibits no edema.   Neurological: He is alert and oriented to person, place, and time. He has normal strength and normal reflexes. No cranial nerve deficit or sensory deficit. He exhibits normal muscle tone.  Skin: Skin is warm and intact. Capillary refill takes less than 2 seconds. He is not diaphoretic. No erythema. No pallor.  Psychiatric: He has a normal mood and affect. His speech is normal and behavior is normal. Thought content normal. Cognition and memory are normal.  Nursing note and vitals reviewed.    ED Treatments / Results  Labs (all labs ordered are listed, but only abnormal results are displayed) Labs Reviewed  COMPREHENSIVE METABOLIC PANEL - Abnormal; Notable for the following components:      Result Value   Calcium 8.8 (*)    Total Bilirubin 1.4 (*)    All other components within normal limits  CBC WITH DIFFERENTIAL/PLATELET  I-STAT TROPONIN, ED    EKG None  Radiology No results found.  Procedures Procedures (including critical care time)  Medications Ordered in ED Medications  sodium chloride 0.9 % bolus 1,000 mL (0 mLs Intravenous Stopped 05/13/18 1330)     Initial Impression / Assessment and Plan / ED Course  Triage vital signs and the nursing notes have been reviewed.  Pertinent labs & imaging results that were available during care of the patient were reviewed and considered in medical decision making (see chart for details).  Patient presents in no acute distress, well appearing and in no acute distress. Given history, patient's symptoms consistent with heat exhaustion; however, must rule out acute neuro or cardiac event.  Clinical Course as of May 13 1609  Mon May 13, 2018  1148 IV fluids administered to help with dehydration. Will re-evaluate patient after.   [GM]  1329 EKG showed NSR. No ST elevations/depressions or signs of acute ischemia or infarct. This is reassuring in combination with negative troponin which assists in evaluating and ruling out an acute  cardiac process. Prelim labs drawn were normal.   [GM]    Clinical Course User Index [GM] Laiken Sandy, Sharyon MedicusGabrielle I, PA-C   Fortunately, his physical exam was normal. It was also reassuring that his blood work and EKG were normal as well. No focal neuro deficits. Patient would benefit from change in work duties given that he has had similar symptoms multiple times since he was first seen in the ED for heat exhaustion in 04/2017. Advised to follow-up with PCP to discuss appropriate paperwork to complete to get a change in job responsibilities.  Final Clinical Impressions(s) / ED Diagnoses  1. Heat Exhaustion. Treated with IV fluids. Education provided on lifestyle and occupation modifications. Note given for work restrictions.  Dispo: Home. After thorough clinical evaluation, this patient is determined to be medically stable and can be safely discharged with the previously mentioned treatment and/or outpatient follow-up/referral(s). At this time, there are no other apparent medical conditions that require further screening, evaluation or treatment.   Final  diagnoses:  Heat exhaustion, initial encounter    ED Discharge Orders    None        Reva Bores 05/13/18 1610    Melene Plan, DO 05/13/18 1612

## 2020-06-09 ENCOUNTER — Emergency Department (HOSPITAL_COMMUNITY)
Admission: EM | Admit: 2020-06-09 | Discharge: 2020-06-09 | Disposition: A | Discharge status: Home / Self Care | Payer: Federal, State, Local not specified - PPO | Attending: Emergency Medicine | Admitting: Emergency Medicine

## 2020-06-09 ENCOUNTER — Emergency Department (HOSPITAL_BASED_OUTPATIENT_CLINIC_OR_DEPARTMENT_OTHER): Payer: Federal, State, Local not specified - PPO

## 2020-06-09 ENCOUNTER — Other Ambulatory Visit: Payer: Self-pay

## 2020-06-09 ENCOUNTER — Encounter (HOSPITAL_COMMUNITY): Payer: Self-pay | Admitting: *Deleted

## 2020-06-09 DIAGNOSIS — M79605 Pain in left leg: Secondary | ICD-10-CM | POA: Insufficient documentation

## 2020-06-09 DIAGNOSIS — M7989 Other specified soft tissue disorders: Secondary | ICD-10-CM

## 2020-06-09 DIAGNOSIS — R52 Pain, unspecified: Secondary | ICD-10-CM

## 2020-06-09 DIAGNOSIS — M79604 Pain in right leg: Secondary | ICD-10-CM | POA: Diagnosis present

## 2020-06-09 DIAGNOSIS — I10 Essential (primary) hypertension: Secondary | ICD-10-CM | POA: Diagnosis not present

## 2020-06-09 NOTE — ED Provider Notes (Signed)
[name] Provider Note   CSN: [id number] Arrival date & time: 06/09/20  1023     History Chief Complaint  Patient presents with  . Leg Pain    [name] is a 64 y.o. 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 Describes the pain as numbness and tingling throughout the lower legs. States the pain has worsened on the left side and was unable to walk due to pain yesterday. Notes some associated swelling in toes. He goes on regular walks with wife and denies any recent changes in activity. [place] home in [place] which he drove to about 5 days ago and returned 2 days ago from.   Follow with Vascular Surgery at [facility] for factor V Leiden and DVT. Takes ASA 81 for prophylaxis and wears compression stockings. Reports compliance with ASA and but forgets to take hypertension medications and has been finding it difficult to wear compression stockings in the summer. Aside from leg pain he denies fever, chills, chest pain, cough, hemoptysis, palpitation, lightheadedness, and dizziness.     Past Medical History:  Diagnosis Date  . Chronic left-sided low back pain with left-sided sciatica   . Esophageal reflux   . Hyperlipidemia   . Hypertension   . Irritable colon   . Lumbar spondylosis     Patient Active Problem List   Diagnosis Date Noted  . Paresthesia 06/19/2017    History reviewed. No pertinent surgical history.     Family History  Problem Relation Age of Onset  . Diabetes Mother   . Heart disease Mother   . Parkinson's disease Father   . Heart disease Father   . Diabetes Father     Social History   Tobacco Use  . Smoking status: Never Smoker  . Smokeless tobacco: Current User    Types: Snuff  Substance Use Topics  . Alcohol  use: No  . Drug use: No    Home Medications Prior to Admission medications   Medication Sig Start Date End Date Taking? Authorizing Provider  fluticasone (FLONASE) 50 MCG/ACT nasal spray Place into both nostrils daily.    [provider]  ibuprofen (ADVIL,MOTRIN) 200 MG tablet Take 200 mg by mouth every 6 (six) hours as needed (for pain).    [provider]  lisinopril (PRINIVIL,ZESTRIL) 10 MG tablet Take 10 mg by mouth daily.    [provider]    Allergies    Patient has no known allergies.  Review of Systems   Review of Systems  Constitutional: Negative for chills and fever.  HENT: Negative for facial swelling and sinus pain.   Eyes: Negative for pain and discharge.  Respiratory: Negative for cough and chest tightness.   Cardiovascular: Negative for chest pain and palpitations.  Gastrointestinal: Negative for abdominal distention, abdominal pain, nausea and vomiting.  Genitourinary: Negative for dysuria and frequency.  Musculoskeletal:       Bilateral leg pain with swelling in toes  Skin: Negative for pallor and rash.  Neurological: Negative for dizziness, weakness, light-headedness and headaches.  Psychiatric/Behavioral: Negative for agitation and confusion.    Physical Exam Updated Vital Signs BP (!) 149/60 (BP Location: Right Arm)   Pulse 80   Temp 98 F (36.7 C) (Oral)   Resp 17  Ht 5\' 11"  (1.803 m)   Wt 124.7 kg   SpO2 99%   BMI 38.34 kg/m   Physical Exam Constitutional:      Appearance: Normal appearance. He is obese.  HENT:     Head: Normocephalic and atraumatic.     Right Ear: External ear normal.     Left Ear: External ear normal.     Nose: Nose normal.     Mouth/Throat:     Mouth: Mucous membranes are moist.     Pharynx: Oropharynx is clear.  Eyes:     Extraocular Movements: Extraocular movements intact.     Conjunctiva/sclera: Conjunctivae normal.     Pupils: Pupils are equal, round, and reactive to light.    Cardiovascular:     Rate and Rhythm: Normal rate and regular rhythm.     Pulses: Normal pulses.          Dorsalis pedis pulses are 2+ on the right side and 2+ on the left side.     Heart sounds: Normal heart sounds.  Pulmonary:     Effort: Pulmonary effort is normal.     Breath sounds: Normal breath sounds.  Abdominal:     General: Abdomen is flat. Bowel sounds are normal.     Palpations: Abdomen is soft.  Musculoskeletal:        General: Normal range of motion.     Cervical back: Normal range of motion and neck supple.     Right lower leg: No edema.     Left lower leg: No edema.  Skin:    General: Skin is warm and dry.     Capillary Refill: Capillary refill takes less than 2 seconds.  Neurological:     General: No focal deficit present.     Mental Status: He is alert and oriented to person, place, and time. Mental status is at baseline.  Psychiatric:        Mood and Affect: Mood normal.        Behavior: Behavior normal.     ED Results / Procedures / Treatments   Labs (all labs ordered are listed, but only abnormal results are displayed) Labs Reviewed - No data to display  EKG None  Radiology VAS LOWER EXTREMITY VENOUS (DVT) (ONLY [facility] & [facility])  Result Date: 06/09/2020  Lower Venous DVTStudy Indications: Pain, Swelling, and History of bilateral lower extremity DVT ~2019.  Comparison Study: No prior study Performing Technologist: 04-17-1980 RDMS, RVT, RDCS  Examination Guidelines: A complete evaluation includes B-mode imaging, spectral Doppler, color Doppler, and power Doppler as needed of all accessible portions of each vessel. Bilateral testing is considered an integral part of a complete examination. Limited examinations for reoccurring indications may be performed as noted. The reflux portion of the exam is performed with the patient in reverse Trendelenburg.  +---------+---------------+---------+-----------+----------+--------------+ RIGHT     CompressibilityPhasicitySpontaneityPropertiesThrombus Aging +---------+---------------+---------+-----------+----------+--------------+ CFV      Full           Yes      Yes                                 +---------+---------------+---------+-----------+----------+--------------+ SFJ      Full                                                        +---------+---------------+---------+-----------+----------+--------------+  FV Prox  Full                                                        +---------+---------------+---------+-----------+----------+--------------+ FV Mid   Full                                                        +---------+---------------+---------+-----------+----------+--------------+ FV DistalFull                                                        +---------+---------------+---------+-----------+----------+--------------+ PFV      Full                                                        +---------+---------------+---------+-----------+----------+--------------+ POP      Full           Yes      Yes                                 +---------+---------------+---------+-----------+----------+--------------+ PTV      Full                                                        +---------+---------------+---------+-----------+----------+--------------+ PERO     Full                                                        +---------+---------------+---------+-----------+----------+--------------+   +---------+---------------+---------+-----------+----------+--------------+ LEFT     CompressibilityPhasicitySpontaneityPropertiesThrombus Aging +---------+---------------+---------+-----------+----------+--------------+ CFV      Full           Yes      Yes                                 +---------+---------------+---------+-----------+----------+--------------+ SFJ      Full                                                         +---------+---------------+---------+-----------+----------+--------------+ FV Prox  Full                                                        +---------+---------------+---------+-----------+----------+--------------+  FV Mid   Full                                                        +---------+---------------+---------+-----------+----------+--------------+ FV DistalFull                                                        +---------+---------------+---------+-----------+----------+--------------+ PFV      Full                                                        +---------+---------------+---------+-----------+----------+--------------+ POP      Full           Yes      Yes                                 +---------+---------------+---------+-----------+----------+--------------+ PTV      Full                                                        +---------+---------------+---------+-----------+----------+--------------+ PERO     Full                                                        +---------+---------------+---------+-----------+----------+--------------+     Summary: RIGHT: - There is no evidence of deep vein thrombosis in the lower extremity.  - No cystic structure found in the popliteal fossa.  LEFT: - There is no evidence of deep vein thrombosis in the lower extremity.  - No cystic structure found in the popliteal fossa.  *See table(s) above for measurements and observations.    Preliminary     Procedures Procedures (including critical care time)  Medications Ordered in ED Medications - No data to display  ED Course  I have reviewed the triage vital signs and the nursing notes.  Pertinent labs & imaging results that were available during my care of the patient were reviewed by me and considered in my medical decision making (see chart for details).    MDM Rules/Calculators/A&P                          64 yo  male with history of DVT, factor V Leiden, HTN, HLD presents for bilateral leg pain with pain greater on the left side for the past 3 days concerning for DVT. Vitals are stable and he is otherwise asymptomatic. US negative for DVT. Encouraged patient to continue with DVT prophylaxis with aspirin, compression stocking, and physical activity.  Discharge with return precautions given.   Final Clinical Impression(s) / ED Diagnoses Final  diagnoses:  Pain in both lower extremities    Rx / DC Orders ED Discharge Orders    None       Quincy Simmonds, MD 06/09/20 1956    Tilden Fossa, MD 06/09/20 2313

## 2020-06-09 NOTE — Progress Notes (Signed)
Bilateral lower extremity venous duplex completed .Refer to "CV Proc" under chart review to view preliminary results.  06/09/2020 7:13 PM Eula Fried., MHA, RVT, RDCS, RDMS

## 2020-06-09 NOTE — Discharge Instructions (Addendum)
Today we evaluated for DVT because of your leg pain. Your ultrasound did not show a DVT. Please continue with current preventative measures and continue taking aspirin, using compression stockings, and staying physically active. You can take ibuprofen for leg pain and follow up with your PCP or your vascular surgeon. Please return if the pain worsens, you develop significant swelling or redness in the leg, chest pain, or shortness of breath.

## 2020-06-09 NOTE — ED Triage Notes (Signed)
To ED for eval of bilateral leg pain. States feels same as when he had blood clots. Pt is seen by vascular at [facility]. Pt is scheduled to see him again in March. Called today but unable to see. States he woke up Sunday and severe pain in left leg. States pain is worse now. Left pedal pulse is present with doppler. Positive sensation and and foot is warm to touch
